# Patient Record
Sex: Female | Born: 1965 | Race: White | Hispanic: No | Marital: Married | Smoking: Never smoker
Health system: Southern US, Community
[De-identification: ages and names within clinical notes are randomized; demographics above are authoritative.]

## PROBLEM LIST (undated history)

## (undated) DIAGNOSIS — G43909 Migraine, unspecified, not intractable, without status migrainosus: Secondary | ICD-10-CM

## (undated) DIAGNOSIS — T7840XA Allergy, unspecified, initial encounter: Secondary | ICD-10-CM

## (undated) DIAGNOSIS — Z0000 Encounter for general adult medical examination without abnormal findings: Secondary | ICD-10-CM

## (undated) DIAGNOSIS — I10 Essential (primary) hypertension: Secondary | ICD-10-CM

## (undated) DIAGNOSIS — K529 Noninfective gastroenteritis and colitis, unspecified: Secondary | ICD-10-CM

## (undated) DIAGNOSIS — R635 Abnormal weight gain: Secondary | ICD-10-CM

## (undated) DIAGNOSIS — R1084 Generalized abdominal pain: Secondary | ICD-10-CM

## (undated) DIAGNOSIS — M1990 Unspecified osteoarthritis, unspecified site: Secondary | ICD-10-CM

## (undated) DIAGNOSIS — J069 Acute upper respiratory infection, unspecified: Secondary | ICD-10-CM

## (undated) HISTORY — DX: Allergy, unspecified, initial encounter: T78.40XA

## (undated) HISTORY — DX: Abnormal weight gain: R63.5

## (undated) HISTORY — DX: Noninfective gastroenteritis and colitis, unspecified: K52.9

## (undated) HISTORY — DX: Essential (primary) hypertension: I10

## (undated) HISTORY — DX: Acute upper respiratory infection, unspecified: J06.9

## (undated) HISTORY — DX: Generalized abdominal pain: R10.84

## (undated) HISTORY — DX: Unspecified osteoarthritis, unspecified site: M19.90

## (undated) HISTORY — PX: OVARY SURGERY: SHX727

## (undated) HISTORY — DX: Migraine, unspecified, not intractable, without status migrainosus: G43.909

## (undated) HISTORY — DX: Encounter for general adult medical examination without abnormal findings: Z00.00

## (undated) HISTORY — PX: CHOLECYSTECTOMY: SHX55

---

## 1998-11-25 ENCOUNTER — Emergency Department (HOSPITAL_COMMUNITY): Admission: EM | Admit: 1998-11-25 | Discharge: 1998-11-25 | Payer: Self-pay | Admitting: Emergency Medicine

## 1998-11-25 ENCOUNTER — Encounter: Payer: Self-pay | Admitting: Emergency Medicine

## 1999-01-11 ENCOUNTER — Other Ambulatory Visit: Admission: RE | Admit: 1999-01-11 | Discharge: 1999-01-11 | Payer: Self-pay | Admitting: Family Medicine

## 2000-01-31 ENCOUNTER — Other Ambulatory Visit: Admission: RE | Admit: 2000-01-31 | Discharge: 2000-01-31 | Payer: Self-pay | Admitting: Family Medicine

## 2001-03-26 ENCOUNTER — Other Ambulatory Visit: Admission: RE | Admit: 2001-03-26 | Discharge: 2001-03-26 | Payer: Self-pay | Admitting: Family Medicine

## 2004-06-01 ENCOUNTER — Other Ambulatory Visit: Admission: RE | Admit: 2004-06-01 | Discharge: 2004-06-01 | Payer: Self-pay | Admitting: Internal Medicine

## 2004-12-30 ENCOUNTER — Ambulatory Visit: Payer: Self-pay | Admitting: Family Medicine

## 2005-01-12 ENCOUNTER — Ambulatory Visit: Payer: Self-pay | Admitting: Family Medicine

## 2005-07-14 ENCOUNTER — Ambulatory Visit: Payer: Self-pay | Admitting: Family Medicine

## 2005-07-14 ENCOUNTER — Ambulatory Visit: Payer: Self-pay | Admitting: Cardiology

## 2005-08-10 ENCOUNTER — Encounter (INDEPENDENT_AMBULATORY_CARE_PROVIDER_SITE_OTHER): Payer: Self-pay | Admitting: Specialist

## 2005-08-10 ENCOUNTER — Observation Stay (HOSPITAL_COMMUNITY): Admission: RE | Admit: 2005-08-10 | Discharge: 2005-08-11 | Payer: Self-pay | Admitting: Obstetrics and Gynecology

## 2005-12-29 ENCOUNTER — Ambulatory Visit: Payer: Self-pay | Admitting: Family Medicine

## 2006-01-12 ENCOUNTER — Ambulatory Visit: Payer: Self-pay | Admitting: Family Medicine

## 2006-10-09 ENCOUNTER — Ambulatory Visit: Payer: Self-pay | Admitting: Family Medicine

## 2007-03-19 ENCOUNTER — Ambulatory Visit: Payer: Self-pay | Admitting: Family Medicine

## 2007-03-19 DIAGNOSIS — R635 Abnormal weight gain: Secondary | ICD-10-CM | POA: Insufficient documentation

## 2007-07-02 ENCOUNTER — Telehealth (INDEPENDENT_AMBULATORY_CARE_PROVIDER_SITE_OTHER): Payer: Self-pay | Admitting: *Deleted

## 2007-08-10 ENCOUNTER — Ambulatory Visit: Payer: Self-pay | Admitting: Family Medicine

## 2007-08-10 DIAGNOSIS — I10 Essential (primary) hypertension: Secondary | ICD-10-CM

## 2007-08-10 DIAGNOSIS — J069 Acute upper respiratory infection, unspecified: Secondary | ICD-10-CM | POA: Insufficient documentation

## 2007-11-27 ENCOUNTER — Encounter (INDEPENDENT_AMBULATORY_CARE_PROVIDER_SITE_OTHER): Payer: Self-pay | Admitting: Internal Medicine

## 2007-11-27 ENCOUNTER — Other Ambulatory Visit: Admission: RE | Admit: 2007-11-27 | Discharge: 2007-11-27 | Payer: Self-pay | Admitting: Family Medicine

## 2007-11-27 ENCOUNTER — Ambulatory Visit: Payer: Self-pay | Admitting: Family Medicine

## 2007-11-27 DIAGNOSIS — G43909 Migraine, unspecified, not intractable, without status migrainosus: Secondary | ICD-10-CM

## 2007-11-28 LAB — CONVERTED CEMR LAB
BUN: 10 mg/dL (ref 6–23)
CO2: 27 meq/L (ref 19–32)
Calcium: 9.5 mg/dL (ref 8.4–10.5)
Chloride: 106 meq/L (ref 96–112)
Cholesterol: 167 mg/dL (ref 0–200)
Creatinine, Ser: 0.8 mg/dL (ref 0.4–1.2)
GFR calc Af Amer: 102 mL/min
GFR calc non Af Amer: 84 mL/min
Glucose, Bld: 94 mg/dL (ref 70–99)
HDL: 38.9 mg/dL — ABNORMAL LOW (ref >39.0)
LDL Cholesterol: 107 mg/dL — ABNORMAL HIGH (ref 0–99)
Potassium: 4.4 meq/L (ref 3.5–5.1)
Sodium: 139 meq/L (ref 135–145)
TSH: 1.02 microintl units/mL (ref 0.35–5.50)
Total CHOL/HDL Ratio: 4.3
Triglycerides: 107 mg/dL (ref 0–149)
VLDL: 21 mg/dL (ref 0–40)

## 2007-12-12 ENCOUNTER — Ambulatory Visit: Payer: Self-pay | Admitting: Family Medicine

## 2007-12-12 DIAGNOSIS — K5289 Other specified noninfective gastroenteritis and colitis: Secondary | ICD-10-CM | POA: Insufficient documentation

## 2007-12-27 ENCOUNTER — Ambulatory Visit: Payer: Self-pay | Admitting: Internal Medicine

## 2008-01-10 ENCOUNTER — Ambulatory Visit: Payer: Self-pay | Admitting: Internal Medicine

## 2008-01-10 LAB — CONVERTED CEMR LAB
Fecal Occult Blood: NEGATIVE
OCCULT 1: NEGATIVE
OCCULT 2: NEGATIVE
OCCULT 3: NEGATIVE
OCCULT 4: NEGATIVE
OCCULT 5: NEGATIVE

## 2008-05-14 ENCOUNTER — Ambulatory Visit: Payer: Self-pay | Admitting: Family Medicine

## 2009-05-25 ENCOUNTER — Emergency Department (HOSPITAL_COMMUNITY): Admission: EM | Admit: 2009-05-25 | Discharge: 2009-05-25 | Payer: Self-pay | Admitting: Emergency Medicine

## 2009-05-27 ENCOUNTER — Ambulatory Visit: Payer: Self-pay | Admitting: Family Medicine

## 2009-05-27 DIAGNOSIS — R1084 Generalized abdominal pain: Secondary | ICD-10-CM | POA: Insufficient documentation

## 2009-05-29 LAB — CONVERTED CEMR LAB
ALT: 16 units/L (ref 0–35)
AST: 20 units/L (ref 0–37)
Albumin: 4.4 g/dL (ref 3.5–5.2)
Alkaline Phosphatase: 70 units/L (ref 39–117)
BUN: 13 mg/dL (ref 6–23)
Bilirubin, Direct: 0.1 mg/dL (ref 0.0–0.3)
CO2: 28 meq/L (ref 19–32)
Calcium: 9.1 mg/dL (ref 8.4–10.5)
Chloride: 111 meq/L (ref 96–112)
Cholesterol: 154 mg/dL (ref 0–200)
Creatinine, Ser: 0.8 mg/dL (ref 0.4–1.2)
GFR calc non Af Amer: 83.35 mL/min (ref >60)
Glucose, Bld: 98 mg/dL (ref 70–99)
HDL: 34.9 mg/dL — ABNORMAL LOW (ref >39.00)
LDL Cholesterol: 97 mg/dL (ref 0–99)
Potassium: 4.3 meq/L (ref 3.5–5.1)
Sodium: 142 meq/L (ref 135–145)
Total Bilirubin: 0.6 mg/dL (ref 0.3–1.2)
Total CHOL/HDL Ratio: 4
Total Protein: 7.4 g/dL (ref 6.0–8.3)
Triglycerides: 110 mg/dL (ref 0.0–149.0)
VLDL: 22 mg/dL (ref 0.0–40.0)

## 2010-04-21 ENCOUNTER — Telehealth (INDEPENDENT_AMBULATORY_CARE_PROVIDER_SITE_OTHER): Payer: Self-pay | Admitting: *Deleted

## 2010-04-22 ENCOUNTER — Ambulatory Visit: Payer: Self-pay | Admitting: Family Medicine

## 2010-04-23 LAB — CONVERTED CEMR LAB
Cholesterol: 122 mg/dL (ref 0–200)
Glucose, Bld: 93 mg/dL (ref 70–99)
HDL: 32.7 mg/dL — ABNORMAL LOW (ref >39.00)
LDL Cholesterol: 78 mg/dL (ref 0–99)
Total CHOL/HDL Ratio: 4
Triglycerides: 57 mg/dL (ref 0.0–149.0)
VLDL: 11.4 mg/dL (ref 0.0–40.0)

## 2010-11-11 NOTE — Progress Notes (Signed)
----   Converted from flag ---- ---- 04/21/2010 10:16 AM, Crawford Givens MD wrote: glucose and lipids v70.0  ---- 04/21/2010 10:14 AM, Mills Koller wrote: Patient is coming in for labs,Terri ------------------------------

## 2011-01-15 LAB — URINALYSIS, ROUTINE W REFLEX MICROSCOPIC
Bilirubin Urine: NEGATIVE
Hgb urine dipstick: NEGATIVE
Protein, ur: NEGATIVE mg/dL
Specific Gravity, Urine: 1.005 (ref 1.005–1.030)
Urobilinogen, UA: 0.2 mg/dL (ref 0.0–1.0)

## 2011-01-15 LAB — DIFFERENTIAL
Eosinophils Relative: 1 % (ref 0–5)
Lymphocytes Relative: 28 % (ref 12–46)
Lymphs Abs: 1.7 10*3/uL (ref 0.7–4.0)
Neutro Abs: 3.6 10*3/uL (ref 1.7–7.7)
Neutrophils Relative %: 59 % (ref 43–77)

## 2011-01-15 LAB — COMPREHENSIVE METABOLIC PANEL
AST: 21 U/L (ref 0–37)
CO2: 23 mEq/L (ref 19–32)
Calcium: 8.9 mg/dL (ref 8.4–10.5)
Creatinine, Ser: 0.76 mg/dL (ref 0.4–1.2)
GFR calc Af Amer: >60 mL/min (ref >60)
GFR calc non Af Amer: >60 mL/min (ref >60)

## 2011-01-15 LAB — CBC
MCHC: 34.4 g/dL (ref 30.0–36.0)
MCV: 89.7 fL (ref 78.0–100.0)
RBC: 4.48 MIL/uL (ref 3.87–5.11)

## 2011-01-15 LAB — LIPASE, BLOOD: Lipase: 34 U/L (ref 11–59)

## 2011-02-22 NOTE — Assessment & Plan Note (Signed)
[facility]                         GASTROENTEROLOGY OFFICE NOTE   NAME:Amber Baker, Amber Baker                        MRN:          [id number]  DATE:12/27/2007                            DOB:          1965-10-18    CHIEF COMPLAINT:  Mucus in stools.   Ms. Wurtzel noted in early February the onset of some intermittent mucus  in her stool.  There is no change in caliber no particular changes of  bleeding or anything like that.  In late February, early March she had  vomiting and diarrhea with a gastroenteritis, she had loose stool  through early March.  That has resolved.  She stopped seeing mucus in  between the stools but then it has been mixed with her stool.  One time  last week she had mucus.  It seems to be weaning somewhat.  No fever or  chills, no crampy abdominal pain, no incontinence, no nocturnal  symptoms.  It is notable that her mother-in-law died of lung cancer last  Fall and her husband was recently diagnosed and successfully treated for  bladder cancer.  She said that has raised her awareness about her body  functions and she has been concerned about the mucus in relation to  those events.  She actually has had some dreams of being on the commode  and straining to stool and has awakened and found herself in bed bearing  down somewhat but no defecation.  She has a history of hemorrhoids but  no bleeding from that.  Her GI review of systems is otherwise negative  at this time.  She saw Billie Bean for a physical and sore throat  problems, she has not had antibiotics.  CBC, TSH have been normal in the  last few months.  I have reviewed those records and the electronic  medical record.   PAST MEDICAL HISTORY:  1. Hypertension.  2. Nephrolithiasis 14 years ago.   PAST SURGICAL HISTORY:  1. Cholecystectomy 1995.  2. Oophorectomy, she thinks the left.   MEDICATIONS:  1. Ramipril 5 mg daily.  2. Calcium.  3. Magnesium.  4. Zinc supplement daily,  but intermittently.  5. Excedrin Migraine p.r.n.  6. BCs p.r.n.   ALLERGIES:  TO PENICILLIN ARE NOTED.   FAMILY HISTORY:  Diabetes in her aunt and has Crohn's disease.   SOCIAL HISTORY:  1. She is married.  2. One daughter.  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  4. No alcohol, tobacco or drugs.   REVIEW OF SYSTEMS:  Completely negative and has been reflected in my  medical history form.   PHYSICAL EXAM:  A well-developed, well-nourished white woman, height 5  feet 4 inches, weight 176 pounds, blood pressure 118/76, pulse 88.  EYES:  Anicteric.  MOUTH/POSTERIOR PHARYNX:  Free of lesions.  NECK:  Supple, without mass or thyromegaly.  CHEST:  Clear.  HEART:  S1 S2, no murmurs, rubs or gallops.  ABDOMEN:  Soft, nontender, no organomegaly or mass.  RECTAL EXAM:  In the presence of female nursing staff shows scanty brown  stool that is Hemoccult negative with no  mass.  There are some small old  external hemorrhoids noted.  SKIN:  Warm, dry, no acute rash.  EXTREMITIES:  Free of cyanosis, clubbing or edema.  She is alert and oriented x3 and cranial nerves II through XII are  grossly intact.  There is no cervical or supraclavicular  lymphadenopathy.   ASSESSMENT:  Mucus in her stools.  It sounds to me like she had the  start of that and then she had gastroenteritis which would cause some  loose stools but that has resolved.  I suspect overall this is probably  a variant of irritable bowel syndrome.  I explained to her why I thought  that, I think these recent family stressors may be playing some role.   RECOMMENDATIONS AND PLAN:  1. Home Hemoccults.  If positive, colonoscopy.  2. FiberCon 2-4 a day.  3. If she has persistent symptoms then a colonoscopy could be done,      but I told her I thought it was a little early to proceed to that.      However, if this persists or there are other changes do so.      Otherwise, assuming this smoothes out I would wait until she were      51 for a  screening colonoscopy.  She knows to call me if there are      changes and we will notify her regarding the Hemoccults.      Otherwise, she will follow up with Everrett Coombe, NP.     Iva Boop, MD,FACG  Electronically Signed    CEG/MedQ  DD: 12/27/2007  DT: 12/27/2007  Job #: [id number]   cc:   Willaim Sheng D. Bean, FNP

## 2011-02-25 NOTE — Op Note (Signed)
Amber Baker                 ACCOUNT NO.:  [id number]   MEDICAL RECORD NO.:  [id number]          PATIENT TYPE:  AMB   LOCATION:  SDC                           FACILITY:  [facility]   PHYSICIAN:  Miguel Aschoff, M.D.       DATE OF BIRTH:  1966/06/02   DATE OF PROCEDURE:  08/10/2005  DATE OF DISCHARGE:                                 OPERATIVE REPORT   PREOPERATIVE DIAGNOSIS:  8 cm left ovarian dermoid cyst.   POSTOPERATIVE DIAGNOSIS:  8 cm left ovarian dermoid cyst.   PROCEDURE:  Laparoscopic left salpingo-oophorectomy.   SURGEON:  Dr. Miguel Aschoff.   ASSISTANT:  Gerrit Friends. Aldona Bar, M.D.   ANESTHESIA:  General.   COMPLICATIONS:  None.   JUSTIFICATION:  The patient is a 45 year old white female noted on CT of the  abdomen to have a 6.9 x 5.6 x 8 cm left adnexal mass which was compatible  with a dermoid tumor. The pathology was discussed with the patient and the  options for therapy reviewed and she presents now to undergo laparoscopic  left salpingo-oophorectomy. The patient understands a laparotomy may be  necessary. Informed consent has been obtained.   PROCEDURE:  The patient was taken to the operating placed in supine  position. General anesthesia was administered without difficulty. She was  then placed in dorsolithotomy position, prepped and draped in usual sterile  fashion. Bladder was catheterized and Hulka tenaculum was placed through the  cervix and held. Attention was then directed to the umbilicus where a small  infraumbilical incision was made. A Veress needle was inserted and the  abdomen was insufflated with 3 liters CO2. Following insufflation, the  trocar to laparoscope was placed followed by laparoscope itself. Systematic  inspection revealed uterus to be normal size and shape, anterior bladder  peritoneum was unremarkable. The right tube was unremarkable as was the  right ovary. On the left side there was an 8 cm complex appearing ovarian  mass encompassing the whole  ovary with no normal ovarian tissue being  visible. There were no other abnormalities noted in the abdomen. At this  point a 5-mm suprapubic port was established under direct visualization as  well as 11 mm left lower quadrant port. Then using tripolar cautery forceps  it was possible to cauterize and cut across the left infundibulopelvic  ligament with care to avoid any injury to ureter. This dissection continued  until the utero-ovarian ligament was found, cauterized and cut. This  dissection incorporated cauterization and cutting of the meso-ovarian  ligament as well as the mesosalpinx. At this point it was possible to free  the left tube and ovary from the uterus and other pelvic structures. Once  specimen was free and EndoCatch bag was introduced and specimen was placed  in the EndoCatch bag and brought out through the 11 mm port in left lower  quadrant. At this point the fascia was incised. The EndoCatch bag opened.  The cyst drained and removed without difficulty. Once this was done, a final  inspection was made for hemostasis. Hemostasis appeared to be excellent. At  this point the CO2 was allowed to escape. All instruments were removed. The  subumbilical and suprapubic port sites were closed using subcuticular 4-0  Vicryl. The left lower quadrant port site was closed by placing retractors  in the incision to visualize the fascia. The fascia was then closed using  running interlocking 0 Vicryl suture.  Subcutaneous tissue was closed using interrupted 0 Vicryl suture and skin  incision was closed using the subcuticular 4-0 Vicryl. Estimated loss was  approximately 20 mL. The patient tolerated the procedure well and went to  the recovery room in satisfactory condition.      Miguel Aschoff, M.D.  Electronically Signed     AR/MEDQ  D:  08/10/2005  T:  08/10/2005  Job:  [id number]

## 2012-04-11 ENCOUNTER — Encounter: Payer: Self-pay | Admitting: Family Medicine

## 2012-04-16 ENCOUNTER — Ambulatory Visit
Admission: RE | Admit: 2012-04-16 | Discharge: 2012-04-16 | Disposition: A | Discharge status: Home / Self Care | Payer: BC Managed Care – PPO | Source: Ambulatory Visit | Attending: Family Medicine | Admitting: Family Medicine

## 2012-04-16 ENCOUNTER — Ambulatory Visit (INDEPENDENT_AMBULATORY_CARE_PROVIDER_SITE_OTHER): Payer: BC Managed Care – PPO | Admitting: Family Medicine

## 2012-04-16 ENCOUNTER — Encounter: Payer: Self-pay | Admitting: Family Medicine

## 2012-04-16 VITALS — BP 118/80 | HR 72 | Temp 98.4°F | Wt 184.0 lb

## 2012-04-16 DIAGNOSIS — R1032 Left lower quadrant pain: Secondary | ICD-10-CM

## 2012-04-16 LAB — POCT URINALYSIS DIPSTICK
Bilirubin, UA: NEGATIVE
Glucose, UA: NEGATIVE
Leukocytes, UA: NEGATIVE
Nitrite, UA: NEGATIVE

## 2012-04-16 NOTE — Addendum Note (Signed)
Addended by: Eliezer Bottom on: 04/16/2012 08:30 AM   Modules accepted: Orders

## 2012-04-16 NOTE — Progress Notes (Signed)
  Subjective:    Patient ID: Amber Baker, female    DOB: [birth year], 46 y.o.   MRN: [id number]  HPI  46 yo pleasant female new to me here for LLQ pain four days ago that has since resolved.  She was a little constipated- MOM helped with constipation and symptoms resolved shortly afterwards. No blood in stool. No nausea or vomiting. No dysuria.  Does have a h/o fibroid tumors- pain resolved shortly after her menstrual cycle subsided although it started before her bleeding started.  Never had anything like this before.  Patient Active Problem List  Diagnosis  . MIGRAINE HEADACHE  . ESSENTIAL HYPERTENSION, BENIGN  . URI  . GASTROENTERITIS  . WEIGHT GAIN  . ABDOMINAL PAIN, GENERALIZED  . Left lower quadrant pain   Past Medical History  Diagnosis Date  . Abdominal pain, generalized   . Essential hypertension, benign   . Gastroenteritis   . Migraine, unspecified, without mention of intractable migraine without mention of status migrainosus   . Acute upper respiratory infections of unspecified site   . Abnormal weight gain   . Routine general medical examination at a health care facility    No past surgical history on file. History  Substance Use Topics  . Smoking status: Never Smoker   . Smokeless tobacco: Not on file  . Alcohol Use: Not on file   No family history on file. Allergies  Allergen Reactions  . Penicillins     REACTION: unknown   Current Outpatient Prescriptions on File Prior to Visit  Medication Sig Dispense Refill  . loratadine (CLARITIN) 10 MG tablet Take 10 mg by mouth daily.      . pantoprazole (PROTONIX) 40 MG tablet Take 40 mg by mouth daily.      . ramipril (ALTACE) 5 MG capsule Take 5 mg by mouth daily.       The PMH, PSH, Social History, Family History, Medications, and allergies have been reviewed in [facility], and have been updated if relevant.   Review of Systems See HPI No fever    Objective:   Physical Exam BP 118/80  Pulse 72  Temp  98.4 F (36.9 C)  Wt 184 lb (83.462 kg)  General:  Well-developed,well-nourished,in no acute distress; alert,appropriate and cooperative throughout examination Head:  normocephalic and atraumatic.   Heart:  Normal rate and regular rhythm. S1 and S2 normal without gallop, murmur, click, rub or other extra sounds. Abdomen:  Bowel sounds positive,abdomen soft, mildy TTP over LLQ Skin:  Intact without suspicious lesions or rashes Psych:  Cognition and judgment appear intact. Alert and cooperative with normal attention span and concentration. No apparent delusions, illusions, hallucinations        Assessment & Plan:   1. Left lower quadrant pain  US Transvaginal Non-OB, US Pelvis Complete   New- resolving. UA pos for blood only - still at end of menstrual cycle. Abdominal exam reassuring today- no sign of acute abdomen and symptoms improving. ?uterine fibroid pain- will get pelvic ultrasound to evaluate further. If symptoms deteriorate again, consider CT of abd/pelvis. The patient indicates understanding of these issues and agrees with the plan.

## 2012-04-16 NOTE — Patient Instructions (Addendum)
It was nice to meet you. Please stop by to see Amber Baker on your way out to set up your ultrasound. If your symptoms, please call me immediately.

## 2012-07-23 ENCOUNTER — Other Ambulatory Visit: Payer: Self-pay

## 2012-07-23 MED ORDER — RAMIPRIL 5 MG PO CAPS: 5.0000 mg | ORAL_CAPSULE | Freq: Every day | ORAL | Status: DC

## 2012-07-23 NOTE — Telephone Encounter (Signed)
Pt left v/m requesting refill ramipril until seen 07/26/12. Pt notified done.

## 2012-07-26 ENCOUNTER — Encounter: Payer: Self-pay | Admitting: Family Medicine

## 2012-07-26 ENCOUNTER — Ambulatory Visit (INDEPENDENT_AMBULATORY_CARE_PROVIDER_SITE_OTHER): Payer: BC Managed Care – PPO | Admitting: Family Medicine

## 2012-07-26 VITALS — BP 102/78 | HR 68 | Temp 98.0°F | Wt 182.0 lb

## 2012-07-26 DIAGNOSIS — Z23 Encounter for immunization: Secondary | ICD-10-CM

## 2012-07-26 DIAGNOSIS — L989 Disorder of the skin and subcutaneous tissue, unspecified: Secondary | ICD-10-CM

## 2012-07-26 DIAGNOSIS — Z136 Encounter for screening for cardiovascular disorders: Secondary | ICD-10-CM

## 2012-07-26 DIAGNOSIS — Z0000 Encounter for general adult medical examination without abnormal findings: Secondary | ICD-10-CM

## 2012-07-26 DIAGNOSIS — I10 Essential (primary) hypertension: Secondary | ICD-10-CM

## 2012-07-26 LAB — LIPID PANEL
HDL: 37.1 mg/dL — ABNORMAL LOW (ref >39.00)
LDL Cholesterol: 90 mg/dL (ref 0–99)
Total CHOL/HDL Ratio: 4

## 2012-07-26 LAB — COMPREHENSIVE METABOLIC PANEL
ALT: 23 U/L (ref 0–35)
AST: 21 U/L (ref 0–37)
Albumin: 4.1 g/dL (ref 3.5–5.2)
Calcium: 9.2 mg/dL (ref 8.4–10.5)
Chloride: 105 mEq/L (ref 96–112)
Potassium: 4.1 mEq/L (ref 3.5–5.1)
Sodium: 138 mEq/L (ref 135–145)

## 2012-07-26 MED ORDER — FLUOCINONIDE-E 0.05 % EX CREA: TOPICAL_CREAM | Freq: Two times a day (BID) | CUTANEOUS | Status: DC

## 2012-07-26 MED ORDER — RAMIPRIL 5 MG PO CAPS: 5.0000 mg | ORAL_CAPSULE | Freq: Every day | ORAL | Status: DC

## 2012-07-26 NOTE — Patient Instructions (Addendum)
Good to see you. We will call you with your lab results.  Shirlee Limerick will call you with your dermatology appointment.

## 2012-07-26 NOTE — Addendum Note (Signed)
Addended by: Eliezer Bottom on: 07/26/2012 08:24 AM   Modules accepted: Orders

## 2012-07-26 NOTE — Progress Notes (Signed)
  Subjective:    Patient ID: Amber Baker, female    DOB: 27-Oct-1965, 46 y.o.   MRN: [id number]  HPI  46 yo very pleasant female here with h/o HTN here for rx refilled and for skin lesions.  1.  HTN- has been stable on Altace 5 mg daily. Denies any HA, blurred vision, LE edema or SOB.  2. Skin lesions- has itchy, flaky lesion in her scalp. Never had anything like this before- comes and goes for past several months.  Arms often get raised, itchy rash bilaterally- she is a hair stylist and works with color.  She is not sure if she is coming into contact with something she is allergic to. No wheezing or SOB.  Patient Active Problem List  Diagnosis  . MIGRAINE HEADACHE  . ESSENTIAL HYPERTENSION, BENIGN  . URI  . GASTROENTERITIS  . WEIGHT GAIN  . ABDOMINAL PAIN, GENERALIZED  . Left lower quadrant pain   Past Medical History  Diagnosis Date  . Abdominal pain, generalized   . Essential hypertension, benign   . Gastroenteritis   . Migraine, unspecified, without mention of intractable migraine without mention of status migrainosus   . Acute upper respiratory infections of unspecified site   . Abnormal weight gain   . Routine general medical examination at a health care facility    No past surgical history on file. History  Substance Use Topics  . Smoking status: Never Smoker   . Smokeless tobacco: Not on file  . Alcohol Use: Not on file   No family history on file. Allergies  Allergen Reactions  . Penicillins     REACTION: unknown   Current Outpatient Prescriptions on File Prior to Visit  Medication Sig Dispense Refill  . Evening Primrose Oil CAPS Take by mouth.      . fexofenadine (ALLEGRA) 180 MG tablet Take 180 mg by mouth daily.      . pantoprazole (PROTONIX) 40 MG tablet Take 40 mg by mouth daily.      Marland Kitchen DISCONTD: ramipril (ALTACE) 5 MG capsule Take 1 capsule (5 mg total) by mouth daily.  30 capsule  0   The PMH, PSH, Social History, Family History, Medications, and  allergies have been reviewed in [facility], and have been updated if relevant.   Review of Systems See HPI    Objective:   Physical Exam BP 102/78  Pulse 68  Temp 98 F (36.7 C)  Wt 182 lb (82.555 kg) Gen:  Alert, pleasant, NAD HEENT: MMM Small, flaky, scaly lesion on back of scalp, along hairline.  No other similar lesions. Resp:  CTA bilaterally CVS:  RRR Ext:  No edema Skin: Faint elevated macular rash on forearms bilaterally Psych:  Good eye contact.  Not anxious or depressed appearing.     Assessment & Plan:   1. Skin lesion  Area on scalp is suspicious for psoriasis- will refer to derm for biopsy and treatment. Area on arms does seem consistent with allergic dermatitis- advised trying new gloves at work. Lidex rx given. Ambulatory referral to Dermatology  2. Routine general medical examination at a health care facility  Comprehensive metabolic panel  3. Screening for ischemic heart disease  Lipid Panel  4. Essential hypertension, benign  Stable.  Altace refilled.

## 2012-07-30 ENCOUNTER — Encounter: Payer: Self-pay | Admitting: *Deleted

## 2013-02-14 ENCOUNTER — Encounter: Payer: Self-pay | Admitting: Family Medicine

## 2013-02-14 ENCOUNTER — Encounter: Payer: Self-pay | Admitting: *Deleted

## 2013-02-14 ENCOUNTER — Other Ambulatory Visit (HOSPITAL_COMMUNITY)
Admission: RE | Admit: 2013-02-14 | Discharge: 2013-02-14 | Disposition: A | Discharge status: Home / Self Care | Payer: BC Managed Care – PPO | Source: Ambulatory Visit | Attending: Family Medicine | Admitting: Family Medicine

## 2013-02-14 ENCOUNTER — Ambulatory Visit (INDEPENDENT_AMBULATORY_CARE_PROVIDER_SITE_OTHER): Payer: BC Managed Care – PPO | Admitting: Family Medicine

## 2013-02-14 VITALS — BP 120/82 | HR 64 | Temp 98.0°F | Ht 64.25 in | Wt 188.0 lb

## 2013-02-14 DIAGNOSIS — Z0000 Encounter for general adult medical examination without abnormal findings: Secondary | ICD-10-CM | POA: Insufficient documentation

## 2013-02-14 DIAGNOSIS — Z1231 Encounter for screening mammogram for malignant neoplasm of breast: Secondary | ICD-10-CM

## 2013-02-14 DIAGNOSIS — I10 Essential (primary) hypertension: Secondary | ICD-10-CM

## 2013-02-14 DIAGNOSIS — Z1151 Encounter for screening for human papillomavirus (HPV): Secondary | ICD-10-CM | POA: Insufficient documentation

## 2013-02-14 DIAGNOSIS — Z136 Encounter for screening for cardiovascular disorders: Secondary | ICD-10-CM

## 2013-02-14 DIAGNOSIS — R635 Abnormal weight gain: Secondary | ICD-10-CM

## 2013-02-14 DIAGNOSIS — J309 Allergic rhinitis, unspecified: Secondary | ICD-10-CM

## 2013-02-14 DIAGNOSIS — Z01419 Encounter for gynecological examination (general) (routine) without abnormal findings: Secondary | ICD-10-CM | POA: Insufficient documentation

## 2013-02-14 LAB — COMPREHENSIVE METABOLIC PANEL
ALT: 36 U/L — ABNORMAL HIGH (ref 0–35)
AST: 34 U/L (ref 0–37)
CO2: 26 mEq/L (ref 19–32)
Chloride: 107 mEq/L (ref 96–112)
GFR: 85.65 mL/min (ref >60.00)
Sodium: 138 mEq/L (ref 135–145)
Total Bilirubin: 0.6 mg/dL (ref 0.3–1.2)
Total Protein: 7 g/dL (ref 6.0–8.3)

## 2013-02-14 LAB — CBC WITH DIFFERENTIAL/PLATELET
Basophils Relative: 0.8 % (ref 0.0–3.0)
Eosinophils Relative: 4.2 % (ref 0.0–5.0)
Lymphocytes Relative: 25.3 % (ref 12.0–46.0)
MCV: 88.9 fl (ref 78.0–100.0)
Monocytes Absolute: 0.7 10*3/uL (ref 0.1–1.0)
Monocytes Relative: 12 % (ref 3.0–12.0)
Neutrophils Relative %: 57.7 % (ref 43.0–77.0)
Platelets: 330 10*3/uL (ref 150.0–400.0)
RBC: 4.55 Mil/uL (ref 3.87–5.11)
WBC: 5.7 10*3/uL (ref 4.5–10.5)

## 2013-02-14 LAB — LIPID PANEL
Total CHOL/HDL Ratio: 3
VLDL: 12 mg/dL (ref 0.0–40.0)

## 2013-02-14 MED ORDER — FLUTICASONE PROPIONATE 50 MCG/ACT NA SUSP: 2.0000 | Freq: Every day | NASAL | Status: DC

## 2013-02-14 NOTE — Addendum Note (Signed)
Addended by: Eliezer Bottom on: 02/14/2013 10:30 AM   Modules accepted: Orders

## 2013-02-14 NOTE — Progress Notes (Signed)
Subjective:    Patient ID: Amber Baker, female    DOB: 1966-01-02, 47 y.o.   MRN: [id number]  HPI  47 yo G1P1 here for CPX/pap smear. Last pap smear "several years ago."  No h/o abnormal pap smears. Still having periods, though becoming irregular and having some hot flashes- not too bad. Due for mammogram. Paternal aunt had breast CA at older age, otherwise no family h/o breast, uterine, cervical or ovarian CA.  Allergic rhinitis- typically controlled with Allegra.  Past several weeks, still having runny nose,itchy eyes, congestion.  Patient Active Problem List   Diagnosis Date Noted  . Routine general medical examination at a health care facility 02/14/2013  . Allergic rhinitis 02/14/2013  . MIGRAINE HEADACHE 11/27/2007  . ESSENTIAL HYPERTENSION, BENIGN 08/10/2007  . WEIGHT GAIN 03/19/2007   Past Medical History  Diagnosis Date  . Abdominal pain, generalized   . Essential hypertension, benign   . Gastroenteritis   . Migraine, unspecified, without mention of intractable migraine without mention of status migrainosus   . Acute upper respiratory infections of unspecified site   . Abnormal weight gain   . Routine general medical examination at a health care facility    No past surgical history on file. History  Substance Use Topics  . Smoking status: Never Smoker   . Smokeless tobacco: Not on file  . Alcohol Use: Not on file   No family history on file. Allergies  Allergen Reactions  . Penicillins     REACTION: unknown   Current Outpatient Prescriptions on File Prior to Visit  Medication Sig Dispense Refill  . Evening Primrose Oil CAPS Take by mouth.      . fexofenadine (ALLEGRA) 180 MG tablet Take 180 mg by mouth daily.      . fluocinonide-emollient (LIDEX-E) 0.05 % cream Apply topically 2 (two) times daily.  30 g  0  . pantoprazole (PROTONIX) 40 MG tablet Take 40 mg by mouth daily.      . ramipril (ALTACE) 5 MG capsule Take 1 capsule (5 mg total) by mouth daily.  30  capsule  11   No current facility-administered medications on file prior to visit.   The PMH, PSH, Social History, Family History, Medications, and allergies have been reviewed in [facility], and have been updated if relevant.   Review of Systems See HPI  Patient reports no  vision/ hearing changes,anorexia, weight change, fever ,adenopathy, persistant / recurrent hoarseness, swallowing issues, chest pain, edema,persistant / recurrent cough, hemoptysis, dyspnea(rest, exertional, paroxysmal nocturnal), gastrointestinal  bleeding (melena, rectal bleeding), abdominal pain, excessive heart burn, GU symptoms(dysuria, hematuria, pyuria, voiding/incontinence  Issues) syncope, focal weakness, severe memory loss, concerning skin lesions, depression, anxiety, abnormal bruising/bleeding, major joint swelling, breast masses or abnormal vaginal bleeding.     Objective:   Physical Exam BP 120/82  Pulse 64  Temp(Src) 98 F (36.7 C)  Ht 5' 4.25" (1.632 m)  Wt 188 lb (85.276 kg)  BMI 32.02 kg/m2  General:  Well-developed,well-nourished,in no acute distress; alert,appropriate and cooperative throughout examination Head:  normocephalic and atraumatic.   Eyes:  vision grossly intact, pupils equal, pupils round, and pupils reactive to light.   Ears:  R ear normal and L ear normal.   Nose:  no external deformity.   +boggy and erythematous turbinates, no sinus tenderness Mouth:  good dentition.   Neck:  No deformities, masses, or tenderness noted. Breasts:  No mass, nodules, thickening, tenderness, bulging, retraction, inflamation, nipple discharge or skin changes noted.   Lungs:  Normal respiratory effort, chest expands symmetrically. Lungs are clear to auscultation, no crackles or wheezes. Heart:  Normal rate and regular rhythm. S1 and S2 normal without gallop, murmur, click, rub or other extra sounds. Abdomen:  Bowel sounds positive,abdomen soft and non-tender without masses, organomegaly or hernias  noted. Rectal:  no external abnormalities.   Genitalia:  Pelvic Exam:        External: normal female genitalia without lesions or masses        Vagina: normal without lesions or masses        Cervix: normal without lesions or masses        Adnexa: normal bimanual exam without masses or fullness        Uterus: normal by palpation        Pap smear: performed Msk:  No deformity or scoliosis noted of thoracic or lumbar spine.   Extremities:  No clubbing, cyanosis, edema, or deformity noted with normal full range of motion of all joints.   Neurologic:  alert & oriented X3 and gait normal.   Skin:  Intact without suspicious lesions or rashes Cervical Nodes:  No lymphadenopathy noted Axillary Nodes:  No palpable lymphadenopathy Psych:  Cognition and judgment appear intact. Alert and cooperative with normal attention span and concentration. No apparent delusions, illusions, hallucinations        Assessment & Plan:  1. Routine general medical examination at a health care facility Reviewed preventive care protocols, scheduled due services, and updated immunizations Discussed nutrition, exercise, diet, and healthy lifestyle.  - Comprehensive metabolic panel - CBC with Differential  2. Essential hypertension, benign Stable on Altace.  No changes.  3. Other screening mammogram  - MM Digital Screening; Future  4. Allergic rhinitis Deteriorated.  Add flonase to antihistamine. Call or return to clinic prn if these symptoms worsen or fail to improve as anticipated. The patient indicates understanding of these issues and agrees with the plan.   5. Screening for ischemic heart disease  - Lipid Panel

## 2013-02-14 NOTE — Patient Instructions (Addendum)
Great to see you. 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 We will call you with your lab results, send you a letter with your pap smear results unless it is abnormal. Please stop by to see Shirlee Limerick on your way out to set up your referral for your mammogram.

## 2013-02-19 ENCOUNTER — Encounter: Payer: Self-pay | Admitting: *Deleted

## 2013-02-21 ENCOUNTER — Encounter: Payer: Self-pay | Admitting: Family Medicine

## 2013-03-11 ENCOUNTER — Encounter: Payer: Self-pay | Admitting: Family Medicine

## 2013-08-13 ENCOUNTER — Other Ambulatory Visit: Payer: Self-pay | Admitting: *Deleted

## 2013-08-13 MED ORDER — RAMIPRIL 5 MG PO CAPS: 5.0000 mg | ORAL_CAPSULE | Freq: Every day | ORAL | Status: DC

## 2014-01-23 ENCOUNTER — Ambulatory Visit (INDEPENDENT_AMBULATORY_CARE_PROVIDER_SITE_OTHER): Payer: BC Managed Care – PPO | Admitting: Family Medicine

## 2014-01-23 ENCOUNTER — Encounter: Payer: Self-pay | Admitting: Family Medicine

## 2014-01-23 VITALS — BP 130/90 | HR 83 | Temp 98.2°F | Wt 188.0 lb

## 2014-01-23 DIAGNOSIS — J069 Acute upper respiratory infection, unspecified: Secondary | ICD-10-CM

## 2014-01-23 MED ORDER — RAMIPRIL 5 MG PO CAPS: 5.0000 mg | ORAL_CAPSULE | Freq: Every day | ORAL | Status: DC

## 2014-01-23 MED ORDER — SUMATRIPTAN SUCCINATE 50 MG PO TABS: 50.0000 mg | ORAL_TABLET | Freq: Once | ORAL | Status: DC

## 2014-01-23 MED ORDER — AZITHROMYCIN 250 MG PO TABS: ORAL_TABLET | ORAL | Status: DC

## 2014-01-23 NOTE — Progress Notes (Signed)
Pre visit review using our clinic review tool, if applicable. No additional management support is needed unless otherwise documented below in the visit note. 

## 2014-01-23 NOTE — Progress Notes (Signed)
SUBJECTIVE:  Amber Baker is a 48 y.o. female who complains of coryza, congestion, sneezing, sore throat and bilateral sinus pain for 6 days. She denies a history of anorexia, chest pain, fevers and shortness of breath and denies a history of asthma. Patient denies smoke cigarettes.   Patient Active Problem List   Diagnosis Date Noted  . Routine general medical examination at a health care facility 02/14/2013  . Allergic rhinitis 02/14/2013  . MIGRAINE HEADACHE 11/27/2007  . ESSENTIAL HYPERTENSION, BENIGN 08/10/2007  . WEIGHT GAIN 03/19/2007   Past Medical History  Diagnosis Date  . Abdominal pain, generalized   . Essential hypertension, benign   . Gastroenteritis   . Migraine, unspecified, without mention of intractable migraine without mention of status migrainosus   . Acute upper respiratory infections of unspecified site   . Abnormal weight gain   . Routine general medical examination at a health care facility    No past surgical history on file. History  Substance Use Topics  . Smoking status: Never Smoker   . Smokeless tobacco: Not on file  . Alcohol Use: Not on file   No family history on file. Allergies  Allergen Reactions  . Penicillins     REACTION: unknown   Current Outpatient Prescriptions on File Prior to Visit  Medication Sig Dispense Refill  . Evening Primrose Oil CAPS Take by mouth.      . fexofenadine (ALLEGRA) 180 MG tablet Take 180 mg by mouth daily.      . fluticasone (FLONASE) 50 MCG/ACT nasal spray Place 2 sprays into the nose daily.  16 g  6   No current facility-administered medications on file prior to visit.   The PMH, PSH, Social History, Family History, Medications, and allergies have been reviewed in [facility], and have been updated if relevant.  OBJECTIVE: BP 130/90  Pulse 83  Temp(Src) 98.2 F (36.8 C) (Tympanic)  Wt 188 lb (85.276 kg)  SpO2 98%  She appears well, vital signs are as noted. Ears normal.  Throat and pharynx normal.  Neck  supple. No adenopathy in the neck. Nose is congested. Sinuses non tender. The chest is clear, without wheezes or rales.  ASSESSMENT:  viral upper respiratory illness  PLAN: Symptomatic therapy suggested: push fluids, rest and return office visit prn if symptoms persist or worsen. Lack of antibiotic effectiveness discussed with her. Did print out an rx for zpack (PCN allergic) to fill if symptoms do not improve or worsen. Call or return to clinic prn if these symptoms worsen or fail to improve as anticipated.

## 2014-01-23 NOTE — Patient Instructions (Signed)
Take antibiotic as directed if your symptoms do not improve over next several days.  Drink lots of fluids.   Treat sympotmatically with Mucinex, nasal saline irrigation, and Tylenol/Ibuprofen.  Try over the counter nasocort-start with 2 sprays per nostril per day...and then try to taper to 1 spray per nostril once symptoms improve.   You can use warm compresses.     Call if not improving as expected in 5-7 days.

## 2014-02-24 ENCOUNTER — Other Ambulatory Visit: Payer: Self-pay | Admitting: Family Medicine

## 2014-03-30 ENCOUNTER — Other Ambulatory Visit: Payer: Self-pay | Admitting: Family Medicine

## 2014-03-31 NOTE — Telephone Encounter (Signed)
Pt has cpx sxheduled 04/29/14.

## 2014-04-29 ENCOUNTER — Ambulatory Visit (INDEPENDENT_AMBULATORY_CARE_PROVIDER_SITE_OTHER): Payer: BC Managed Care – PPO | Admitting: Family Medicine

## 2014-04-29 ENCOUNTER — Encounter: Payer: Self-pay | Admitting: Family Medicine

## 2014-04-29 VITALS — BP 138/82 | HR 75 | Temp 98.0°F | Ht 64.0 in | Wt 190.5 lb

## 2014-04-29 DIAGNOSIS — I10 Essential (primary) hypertension: Secondary | ICD-10-CM

## 2014-04-29 DIAGNOSIS — R238 Other skin changes: Secondary | ICD-10-CM

## 2014-04-29 DIAGNOSIS — L989 Disorder of the skin and subcutaneous tissue, unspecified: Secondary | ICD-10-CM

## 2014-04-29 DIAGNOSIS — Z136 Encounter for screening for cardiovascular disorders: Secondary | ICD-10-CM

## 2014-04-29 DIAGNOSIS — Z1231 Encounter for screening mammogram for malignant neoplasm of breast: Secondary | ICD-10-CM

## 2014-04-29 DIAGNOSIS — Z0000 Encounter for general adult medical examination without abnormal findings: Secondary | ICD-10-CM

## 2014-04-29 LAB — COMPREHENSIVE METABOLIC PANEL
ALT: 24 U/L (ref 0–35)
AST: 26 U/L (ref 0–37)
Albumin: 4.2 g/dL (ref 3.5–5.2)
Alkaline Phosphatase: 63 U/L (ref 39–117)
BILIRUBIN TOTAL: 0.8 mg/dL (ref 0.2–1.2)
BUN: 14 mg/dL (ref 6–23)
CALCIUM: 9.1 mg/dL (ref 8.4–10.5)
CHLORIDE: 104 meq/L (ref 96–112)
CO2: 23 meq/L (ref 19–32)
Creatinine, Ser: 0.8 mg/dL (ref 0.4–1.2)
GFR: 86.5 mL/min (ref >60.00)
Glucose, Bld: 95 mg/dL (ref 70–99)
Potassium: 4.1 mEq/L (ref 3.5–5.1)
SODIUM: 136 meq/L (ref 135–145)
TOTAL PROTEIN: 7.3 g/dL (ref 6.0–8.3)

## 2014-04-29 LAB — CBC WITH DIFFERENTIAL/PLATELET
BASOS ABS: 0 10*3/uL (ref 0.0–0.1)
Basophils Relative: 0.4 % (ref 0.0–3.0)
Eosinophils Absolute: 0.1 10*3/uL (ref 0.0–0.7)
Eosinophils Relative: 1.1 % (ref 0.0–5.0)
HCT: 41.5 % (ref 36.0–46.0)
Hemoglobin: 14 g/dL (ref 12.0–15.0)
LYMPHS PCT: 25.1 % (ref 12.0–46.0)
Lymphs Abs: 1.3 10*3/uL (ref 0.7–4.0)
MCHC: 33.8 g/dL (ref 30.0–36.0)
MCV: 89.2 fl (ref 78.0–100.0)
MONOS PCT: 10.3 % (ref 3.0–12.0)
Monocytes Absolute: 0.5 10*3/uL (ref 0.1–1.0)
NEUTROS PCT: 63.1 % (ref 43.0–77.0)
Neutro Abs: 3.3 10*3/uL (ref 1.4–7.7)
PLATELETS: 347 10*3/uL (ref 150.0–400.0)
RBC: 4.65 Mil/uL (ref 3.87–5.11)
RDW: 13.5 % (ref 11.5–15.5)
WBC: 5.2 10*3/uL (ref 4.0–10.5)

## 2014-04-29 LAB — LIPID PANEL
Cholesterol: 176 mg/dL (ref 0–200)
HDL: 50.1 mg/dL (ref >39.00)
LDL Cholesterol: 115 mg/dL — ABNORMAL HIGH (ref 0–99)
NONHDL: 125.9
Total CHOL/HDL Ratio: 4
Triglycerides: 56 mg/dL (ref 0.0–149.0)
VLDL: 11.2 mg/dL (ref 0.0–40.0)

## 2014-04-29 LAB — TSH: TSH: 0.97 u[IU]/mL (ref 0.35–4.50)

## 2014-04-29 MED ORDER — FLUOCINONIDE-E 0.05 % EX CREA: TOPICAL_CREAM | Freq: Two times a day (BID) | CUTANEOUS | Status: DC

## 2014-04-29 NOTE — Patient Instructions (Addendum)
Good to see you- please stop by to see Shirlee LimerickMarion on your way out to set up your mammogram- ask about 3 D mammography.  We will call you with your lab results.

## 2014-04-29 NOTE — Progress Notes (Signed)
Subjective:    Patient ID: Amber Baker, female    DOB: 01/16/1966, 48 y.o.   MRN: [id number]  HPI  48 yo G1P1 here for CPX.  Last pap smear 02/14/13 (done by me).  No h/o abnormal pap smears. Due for mammogram. Last mammogram 03/07/13- recommended follow up in 1 year with 3 D mammogram. Periods are becoming irregular- every other month and light. Some mild hot flashes but no issues with insomnia.  Paternal aunt had breast CA at older age, otherwise no family h/o breast, uterine, cervical or ovarian CA.  Scalp lesion- gave her lidex last year and referred her to dermatology.  Was told it was a dermatitis, not psoriasis.  Given kenalog spray which was not effective. She wonders if it was from a high protein/low carb diet she did last year.  Had a friend who did a similar diet and developed a similar scalp lesion.  HTN- BP has been well controlled on Altace 5 mg daily. Denies HA, blurred vision, CP or SOB.   Patient Active Problem List   Diagnosis Date Noted  . Scalp irritation 04/29/2014  . Routine general medical examination at a health care facility 02/14/2013  . Allergic rhinitis 02/14/2013  . MIGRAINE HEADACHE 11/27/2007  . ESSENTIAL HYPERTENSION, BENIGN 08/10/2007  . WEIGHT GAIN 03/19/2007   Past Medical History  Diagnosis Date  . Abdominal pain, generalized   . Essential hypertension, benign   . Gastroenteritis   . Migraine, unspecified, without mention of intractable migraine without mention of status migrainosus   . Acute upper respiratory infections of unspecified site   . Abnormal weight gain   . Routine general medical examination at a health care facility    No past surgical history on file. History  Substance Use Topics  . Smoking status: Never Smoker   . Smokeless tobacco: Not on file  . Alcohol Use: Not on file   No family history on file. Allergies  Allergen Reactions  . Penicillins     REACTION: unknown   Current Outpatient Prescriptions on File  Prior to Visit  Medication Sig Dispense Refill  . fexofenadine (ALLEGRA) 180 MG tablet Take 180 mg by mouth daily.      . fluticasone (FLONASE) 50 MCG/ACT nasal spray Place 2 sprays into the nose daily.  16 g  6  . ramipril (ALTACE) 5 MG capsule Take 1 capsule (5 mg total) by mouth daily.  90 capsule  3  . SUMAtriptan (IMITREX) 50 MG tablet TAKE ONE TABLET AS NEEDED FOR MIGRAINE. MAY REPEAT IN TWO HOURS IF NEEDED *MAX OF 2 TABLETS IN 24 HOURS*  9 tablet  0   No current facility-administered medications on file prior to visit.   The PMH, PSH, Social History, Family History, Medications, and allergies [facility], and have been updated if relevant.   Review of Systems See HPI  Patient reports no  vision/ hearing changes,anorexia, weight change, fever ,adenopathy, persistant / recurrent hoarseness, swallowing issues, chest pain, edema,persistant / recurrent cough, hemoptysis, dyspnea(rest, exertional, paroxysmal nocturnal), gastrointestinal  bleeding (melena, rectal bleeding), abdominal pain, excessive heart burn, GU symptoms(dysuria, hematuria, pyuria, voiding/incontinence  Issues) syncope, focal weakness, severe memory loss, concerning skin lesions, depression, anxiety, abnormal bruising/bleeding, major joint swelling, breast masses or abnormal vaginal bleeding.     Objective:   Physical Exam BP 138/82  Pulse 75  Temp(Src) 98 F (36.7 C) (Oral)  Ht 5\' 4"  (1.626 m)  Wt 190 lb 8 oz (86.41 kg)  BMI  32.68 kg/m2  SpO2 97%  LMP 03/24/2014  General:  Well-developed,well-nourished,in no acute distress; alert,appropriate and cooperative throughout examination Head:  normocephalic and atraumatic.   Eyes:  vision grossly intact, pupils equal, pupils round, and pupils reactive to light.   Ears:  R ear normal and L ear normal.   Mouth:  good dentition.   Neck:  No deformities, masses, or tenderness noted. Breasts:  No mass, nodules, thickening, tenderness, bulging, retraction,  inflamation, nipple discharge or skin changes noted.   Lungs:  Normal respiratory effort, chest expands symmetrically. Lungs are clear to auscultation, no crackles or wheezes. Heart:  Normal rate and regular rhythm. S1 and S2 normal without gallop, murmur, click, rub or other extra sounds. Abdomen:  Bowel sounds positive,abdomen soft and non-tender without masses, organomegaly or hernias noted. Msk:  No deformity or scoliosis noted of thoracic or lumbar spine.   Extremities:  No clubbing, cyanosis, edema, or deformity noted with normal full range of motion of all joints.   Neurologic:  alert & oriented X3 and gait normal.   Skin:  Intact without suspicious lesions or rashes Cervical Nodes:  No lymphadenopathy noted Axillary Nodes:  No palpable lymphadenopathy Psych:  Cognition and judgment appear intact. Alert and cooperative with normal attention span and concentration. No apparent delusions, illusions, hallucinations        Assessment & Plan:

## 2014-04-29 NOTE — Assessment & Plan Note (Signed)
Stable on current rx. No changes. 

## 2014-04-29 NOTE — Assessment & Plan Note (Signed)
Reviewed preventive care protocols, scheduled due services, and updated immunizations Discussed nutrition, exercise, diet, and healthy lifestyle.  Orders Placed This Encounter  Procedures  . MM Digital Screening  . CBC with Differential  . Comprehensive metabolic panel  . Lipid panel  . TSH     

## 2014-04-29 NOTE — Assessment & Plan Note (Signed)
Etiology remains unclear but did respond well to topical steroids. Advised follow up with another dermatologist for biopsy.

## 2014-04-30 ENCOUNTER — Encounter: Payer: Self-pay | Admitting: *Deleted

## 2014-04-30 ENCOUNTER — Telehealth: Payer: Self-pay | Admitting: Family Medicine

## 2014-04-30 NOTE — Telephone Encounter (Signed)
Relevant patient education mailed to patient.  

## 2014-05-20 ENCOUNTER — Other Ambulatory Visit: Payer: Self-pay | Admitting: Family Medicine

## 2014-08-18 ENCOUNTER — Other Ambulatory Visit: Payer: Self-pay | Admitting: Family Medicine

## 2014-11-24 ENCOUNTER — Other Ambulatory Visit: Payer: Self-pay | Admitting: Family Medicine

## 2014-11-25 NOTE — Telephone Encounter (Signed)
Last filled 08/28/14--had CPE 04/2014--please advise

## 2015-01-12 ENCOUNTER — Ambulatory Visit: Payer: BC Managed Care – PPO | Admitting: Family Medicine

## 2015-01-19 ENCOUNTER — Ambulatory Visit (INDEPENDENT_AMBULATORY_CARE_PROVIDER_SITE_OTHER): Payer: BC Managed Care – PPO | Admitting: Family Medicine

## 2015-01-19 ENCOUNTER — Encounter: Payer: Self-pay | Admitting: Family Medicine

## 2015-01-19 VITALS — BP 114/72 | HR 74 | Temp 98.3°F | Ht 64.0 in | Wt 180.5 lb

## 2015-01-19 DIAGNOSIS — R5383 Other fatigue: Secondary | ICD-10-CM | POA: Insufficient documentation

## 2015-01-19 DIAGNOSIS — Z13220 Encounter for screening for lipoid disorders: Secondary | ICD-10-CM

## 2015-01-19 DIAGNOSIS — I10 Essential (primary) hypertension: Secondary | ICD-10-CM | POA: Diagnosis not present

## 2015-01-19 LAB — CBC WITH DIFFERENTIAL/PLATELET
BASOS ABS: 0 10*3/uL (ref 0.0–0.1)
Basophils Relative: 0.7 % (ref 0.0–3.0)
EOS ABS: 0.2 10*3/uL (ref 0.0–0.7)
Eosinophils Relative: 3.2 % (ref 0.0–5.0)
HEMATOCRIT: 42 % (ref 36.0–46.0)
Hemoglobin: 14.3 g/dL (ref 12.0–15.0)
LYMPHS ABS: 1.7 10*3/uL (ref 0.7–4.0)
Lymphocytes Relative: 29.3 % (ref 12.0–46.0)
MCHC: 34 g/dL (ref 30.0–36.0)
MCV: 87.5 fl (ref 78.0–100.0)
MONO ABS: 0.7 10*3/uL (ref 0.1–1.0)
Monocytes Relative: 11.6 % (ref 3.0–12.0)
NEUTROS PCT: 55.2 % (ref 43.0–77.0)
Neutro Abs: 3.1 10*3/uL (ref 1.4–7.7)
PLATELETS: 332 10*3/uL (ref 150.0–400.0)
RBC: 4.8 Mil/uL (ref 3.87–5.11)
RDW: 13.5 % (ref 11.5–15.5)
WBC: 5.7 10*3/uL (ref 4.0–10.5)

## 2015-01-19 LAB — COMPREHENSIVE METABOLIC PANEL
ALBUMIN: 4.2 g/dL (ref 3.5–5.2)
ALT: 19 U/L (ref 0–35)
AST: 18 U/L (ref 0–37)
Alkaline Phosphatase: 63 U/L (ref 39–117)
BUN: 16 mg/dL (ref 6–23)
CHLORIDE: 106 meq/L (ref 96–112)
CO2: 28 mEq/L (ref 19–32)
Calcium: 9.5 mg/dL (ref 8.4–10.5)
Creatinine, Ser: 0.77 mg/dL (ref 0.40–1.20)
GFR: 84.94 mL/min (ref >60.00)
Glucose, Bld: 94 mg/dL (ref 70–99)
POTASSIUM: 4.3 meq/L (ref 3.5–5.1)
Sodium: 139 mEq/L (ref 135–145)
Total Bilirubin: 0.4 mg/dL (ref 0.2–1.2)
Total Protein: 7.3 g/dL (ref 6.0–8.3)

## 2015-01-19 LAB — LIPID PANEL
CHOL/HDL RATIO: 4
Cholesterol: 164 mg/dL (ref 0–200)
HDL: 44.1 mg/dL (ref >39.00)
LDL Cholesterol: 99 mg/dL (ref 0–99)
NonHDL: 119.9
Triglycerides: 103 mg/dL (ref 0.0–149.0)
VLDL: 20.6 mg/dL (ref 0.0–40.0)

## 2015-01-19 LAB — VITAMIN D 25 HYDROXY (VIT D DEFICIENCY, FRACTURES): VITD: 26.34 ng/mL — AB (ref 30.00–100.00)

## 2015-01-19 LAB — TSH: TSH: 1.08 u[IU]/mL (ref 0.35–4.50)

## 2015-01-19 LAB — VITAMIN B12: Vitamin B-12: 771 pg/mL (ref 211–911)

## 2015-01-19 NOTE — Assessment & Plan Note (Signed)
New- likely multifactorial. Exam reassuring.  Start work up with labs today- Orders Placed This Encounter  Procedures  . CBC with Differential/Platelet  . Comprehensive metabolic panel  . Lipid panel  . TSH  . Vitamin D, 25-hydroxy  . Vitamin B12   The patient indicates understanding of these issues and agrees with the plan.

## 2015-01-19 NOTE — Progress Notes (Signed)
Pre visit review using our clinic review tool, if applicable. No additional management support is needed unless otherwise documented below in the visit note. 

## 2015-01-19 NOTE — Progress Notes (Signed)
Subjective:   Patient ID: Amber Baker, female    DOB: 02/07/1966, 49 y.o.   MRN: [id number]  Amber Baker is a pleasant 49 y.o. year old female who presents to clinic today with Follow-up  on 01/19/2015  HPI:  HTN-  BP has been well controlled on low dose Altace.  Denies any HA, blurred vision, LE edema, CP or SOB.  Fatigue- has noticed she has been more tired lately. Started taking OTC Vit B12- 1000 mcg daily and is already feeling a little better.    No blood in stool.  No changes in bowel habits.  No dizziness.   Has noticed difficulty losing weight.  Has lost 10 pounds since I last saw her but she feels she is on a plateau- walking twice a day, cut back on breads and pastas.  Lab Results  Component Value Date   TSH 0.97 04/29/2014   Lab Results  Component Value Date   WBC 5.2 04/29/2014   HGB 14.0 04/29/2014   HCT 41.5 04/29/2014   MCV 89.2 04/29/2014   PLT 347.0 04/29/2014   Wt Readings from Last 3 Encounters:  01/19/15 180 lb 8 oz (81.874 kg)  04/29/14 190 lb 8 oz (86.41 kg)  01/23/14 188 lb (85.276 kg)    Current Outpatient Prescriptions on File Prior to Visit  Medication Sig Dispense Refill  . fexofenadine (ALLEGRA) 180 MG tablet Take 180 mg by mouth daily.    . fluticasone (FLONASE) 50 MCG/ACT nasal spray Place 2 sprays into the nose daily. 16 g 6  . ramipril (ALTACE) 5 MG capsule Take 1 capsule (5 mg total) by mouth daily. 90 capsule 3  . SUMAtriptan (IMITREX) 50 MG tablet TAKE ONE TABLET AS NEEDED FOR MIGRAINE. MAY REPEAT IN TWO HOURS IF NEEDED *MAX OF 2 TABLETS IN 24 HOURS* 9 tablet 1  . fluocinonide-emollient (LIDEX-E) 0.05 % cream Apply topically 2 (two) times daily. (Patient not taking: Reported on 01/19/2015) 30 g 0   No current facility-administered medications on file prior to visit.    Allergies  Allergen Reactions  . Penicillins     REACTION: unknown    Past Medical History  Diagnosis Date  . Abdominal pain, generalized   . Essential  hypertension, benign   . Gastroenteritis   . Migraine, unspecified, without mention of intractable migraine without mention of status migrainosus   . Acute upper respiratory infections of unspecified site   . Abnormal weight gain   . Routine general medical examination at a health care facility     History reviewed. No pertinent past surgical history.  History reviewed. No pertinent family history.  History   Social History  . Marital Status: Married    Spouse Name: N/A  . Number of Children: 1  . Years of Education: N/A   Occupational History  . hair stylist    Social History Main Topics  . Smoking status: Never Smoker   . Smokeless tobacco: Never Used  . Alcohol Use: No  . Drug Use: No  . Sexual Activity: Not on file   Other Topics Concern  . Not on file   Social History Narrative   Husband diagnosed with bladder cancer 07/2007.   The PMH, PSH, Social History, Family History, Medications, and allergies [facility], and have been updated if relevant.  Review of Systems  Constitutional: Positive for fatigue.  HENT: Negative.   Eyes: Negative.   Respiratory: Negative.   Cardiovascular: Negative.   Gastrointestinal:  Negative.   Endocrine: Negative.   Genitourinary: Negative.   Musculoskeletal: Negative.   Skin: Negative.   Allergic/Immunologic: Negative.   Neurological: Negative.   Hematological: Negative.   Psychiatric/Behavioral: Negative.   All other systems reviewed and are negative.      Objective:    BP 114/72 mmHg  Pulse 74  Temp(Src) 98.3 F (36.8 C) (Oral)  Ht  (1.626 m)  Wt 180 lb 8 oz (81.874 kg)  BMI 30.97 kg/m2   Physical Exam        Assessment & Plan:   Essential hypertension, benign  Other fatigue - Plan: CBC with Differential/Platelet, Comprehensive metabolic panel, TSH, Vitamin D, 25-hydroxy, Vitamin B12  Screening, lipid - Plan: Lipid panel No Follow-up on file.

## 2015-01-19 NOTE — Assessment & Plan Note (Signed)
Well controlled on current rx. No changes made.  Check renal function and electrolytes today.

## 2015-01-19 NOTE — Patient Instructions (Signed)
Good to see you. We will call you with your lab results.   

## 2015-01-26 MED ORDER — VITAMIN D (ERGOCALCIFEROL) 1.25 MG (50000 UNIT) PO CAPS: 50000.0000 [IU] | ORAL_CAPSULE | ORAL | Status: DC

## 2015-01-26 NOTE — Addendum Note (Signed)
Addended by: Desmond DikeKNIGHT, Danie Diehl H on: 01/26/2015 04:17 PM   Modules accepted: Orders

## 2015-02-05 ENCOUNTER — Other Ambulatory Visit: Payer: Self-pay | Admitting: Internal Medicine

## 2015-02-05 NOTE — Telephone Encounter (Signed)
Last filled 05/2014--last OV with was a f/u on 01/19/2015--please advise

## 2015-02-25 ENCOUNTER — Other Ambulatory Visit: Payer: Self-pay | Admitting: Family Medicine

## 2015-03-05 ENCOUNTER — Other Ambulatory Visit: Payer: Self-pay | Admitting: Family Medicine

## 2015-03-16 ENCOUNTER — Other Ambulatory Visit: Payer: Self-pay | Admitting: Family Medicine

## 2015-03-16 DIAGNOSIS — E559 Vitamin D deficiency, unspecified: Secondary | ICD-10-CM

## 2015-03-23 ENCOUNTER — Other Ambulatory Visit: Payer: BC Managed Care – PPO

## 2015-03-30 ENCOUNTER — Other Ambulatory Visit (INDEPENDENT_AMBULATORY_CARE_PROVIDER_SITE_OTHER): Payer: BC Managed Care – PPO

## 2015-03-30 DIAGNOSIS — E559 Vitamin D deficiency, unspecified: Secondary | ICD-10-CM | POA: Diagnosis not present

## 2015-03-30 LAB — VITAMIN D 25 HYDROXY (VIT D DEFICIENCY, FRACTURES): VITD: 60.51 ng/mL (ref 30.00–100.00)

## 2015-03-31 ENCOUNTER — Encounter: Payer: Self-pay | Admitting: Family Medicine

## 2015-07-13 ENCOUNTER — Ambulatory Visit (INDEPENDENT_AMBULATORY_CARE_PROVIDER_SITE_OTHER): Payer: BC Managed Care – PPO | Admitting: Family Medicine

## 2015-07-13 ENCOUNTER — Encounter: Payer: Self-pay | Admitting: Family Medicine

## 2015-07-13 VITALS — BP 114/62 | HR 72 | Temp 98.4°F | Wt 187.5 lb

## 2015-07-13 DIAGNOSIS — J069 Acute upper respiratory infection, unspecified: Secondary | ICD-10-CM

## 2015-07-13 MED ORDER — AZITHROMYCIN 250 MG PO TABS: ORAL_TABLET | ORAL | Status: DC

## 2015-07-13 MED ORDER — HYDROCOD POLST-CPM POLST ER 10-8 MG/5ML PO SUER: 5.0000 mL | Freq: Two times a day (BID) | ORAL | Status: DC | PRN

## 2015-07-13 NOTE — Progress Notes (Signed)
SUBJECTIVE:  Amber Baker is a 49 y.o. female who complains of coryza, congestion, productive cough, myalgias and headache for 8 days. She denies a history of anorexia, chest pain, chills and dizziness and denies a history of asthma. Patient denies smoke cigarettes.   Current Outpatient Prescriptions on File Prior to Visit  Medication Sig Dispense Refill  . aspirin 81 MG tablet Take 81 mg by mouth daily.    . Cholecalciferol (VITAMIN D3) 5000 UNITS CAPS Take by mouth daily.    . fexofenadine (ALLEGRA) 180 MG tablet Take 180 mg by mouth daily.    . fluticasone (FLONASE) 50 MCG/ACT nasal spray Place 2 sprays into the nose daily. 16 g 6  . Omega-3 Fatty Acids (OMEGA 3 PO) Take by mouth. Take one 1290 mg daily    . Probiotic Product (PROBIOTIC DAILY PO) Take by mouth daily.    . ramipril (ALTACE) 5 MG capsule TAKE ONE (1) CAPSULE BY MOUTH EACH DAY 90 capsule 1  . SUMAtriptan (IMITREX) 50 MG tablet TAKE ONE TABLET AS NEEDED FOR MIGRAINE. MAY REPEAT IN TWO HOURS IF NEEDED *MAX OF 2 TABLETS IN 24 HOURS* 10 tablet 3  . vitamin B-12 (CYANOCOBALAMIN) 1000 MCG tablet Take 1,000 mcg by mouth daily.     No current facility-administered medications on file prior to visit.    Allergies  Allergen Reactions  . Penicillins     REACTION: unknown    Past Medical History  Diagnosis Date  . Abdominal pain, generalized   . Essential hypertension, benign   . Gastroenteritis   . Migraine, unspecified, without mention of intractable migraine without mention of status migrainosus   . Acute upper respiratory infections of unspecified site   . Abnormal weight gain   . Routine general medical examination at a health care facility     History reviewed. No pertinent past surgical history.  History reviewed. No pertinent family history.  Social History   Social History  . Marital Status: Married    Spouse Name: N/A  . Number of Children: 1  . Years of Education: N/A   Occupational History  . hair  stylist    Social History Main Topics  . Smoking status: Never Smoker   . Smokeless tobacco: Never Used  . Alcohol Use: No  . Drug Use: No  . Sexual Activity: Not on file   Other Topics Concern  . Not on file   Social History Narrative   Husband diagnosed with bladder cancer 07/2007.   The PMH, PSH, Social History, Family History, Medications, and allergies have been reviewed in [facility], and have been updated if relevant.  OBJECTIVE: BP 114/62 mmHg  Pulse 72  Temp(Src) 98.4 F (36.9 C) (Oral)  Wt 187 lb 8 oz (85.049 kg)  SpO2 96%  LMP 03/24/2014  She appears well, vital signs are as noted. Ears normal.  Throat and pharynx normal.  Neck supple. No adenopathy in the neck. Nose is congested. Sinuses non tender. Wheeze RLL  ASSESSMENT:  bronchitis  PLAN: Given duration and progression of symptoms, will treat for bacterial process with Zpack. Tussionex prn cough- aware of sedation precautions.  Symptomatic therapy suggested: push fluids, rest and return office visit prn if symptoms persist or worsen .Call or return to clinic prn if these symptoms worsen or fail to improve as anticipated.

## 2015-08-11 ENCOUNTER — Other Ambulatory Visit: Payer: Self-pay | Admitting: Family Medicine

## 2015-10-06 ENCOUNTER — Telehealth: Payer: Self-pay | Admitting: Family

## 2015-10-06 DIAGNOSIS — J019 Acute sinusitis, unspecified: Secondary | ICD-10-CM

## 2015-10-06 DIAGNOSIS — B9689 Other specified bacterial agents as the cause of diseases classified elsewhere: Secondary | ICD-10-CM

## 2015-10-06 MED ORDER — DOXYCYCLINE HYCLATE 100 MG PO TABS
100.0000 mg | ORAL_TABLET | Freq: Two times a day (BID) | ORAL | Status: DC
Start: 2015-10-06 — End: 2015-12-14

## 2015-10-06 NOTE — Progress Notes (Signed)

## 2015-12-01 ENCOUNTER — Other Ambulatory Visit: Payer: Self-pay | Admitting: Family Medicine

## 2015-12-14 ENCOUNTER — Ambulatory Visit (INDEPENDENT_AMBULATORY_CARE_PROVIDER_SITE_OTHER): Payer: BC Managed Care – PPO | Admitting: Family Medicine

## 2015-12-14 VITALS — BP 142/86 | HR 79 | Temp 98.0°F | Wt 189.5 lb

## 2015-12-14 DIAGNOSIS — H01003 Unspecified blepharitis right eye, unspecified eyelid: Secondary | ICD-10-CM

## 2015-12-14 MED ORDER — POLYMYXIN B-TRIMETHOPRIM 10000-0.1 UNIT/ML-% OP SOLN: 1.0000 [drp] | OPHTHALMIC | Status: DC

## 2015-12-14 NOTE — Progress Notes (Signed)
Pre visit review using our clinic review tool, if applicable. No additional management support is needed unless otherwise documented below in the visit note. 

## 2015-12-14 NOTE — Assessment & Plan Note (Signed)
New- advised supportive care as per AVS. eRx also sent for polytrim drops. Call or return to clinic prn if these symptoms worsen or fail to improve as anticipated. The patient indicates understanding of these issues and agrees with the plan.

## 2015-12-14 NOTE — Progress Notes (Signed)
Subjective:   Patient ID: Amber Baker, female    DOB: 10/08/1966, 50 y.o.   MRN: [id number]  Amber Baker is a pleasant 50 y.o. year old female who presents to clinic today with Belepharitis  on 12/14/2015  HPI:  Two days of right eye lid puffiness and eye irritation.  Her eye was matted shut this morning.  No blurred vision or eye pain but her eye is "irritated."   Current Outpatient Prescriptions on File Prior to Visit  Medication Sig Dispense Refill  . aspirin 81 MG tablet Take 81 mg by mouth daily.    . fexofenadine (ALLEGRA) 180 MG tablet Take 180 mg by mouth daily.    . fluticasone (FLONASE) 50 MCG/ACT nasal spray Place 2 sprays into the nose daily. 16 g 6  . Omega-3 Fatty Acids (OMEGA 3 PO) Take by mouth. Take one 1290 mg daily    . Probiotic Product (PROBIOTIC DAILY PO) Take by mouth daily.    . ramipril (ALTACE) 5 MG capsule TAKE ONE CAPSULE BY MOUTH DAILY 90 capsule 1  . SUMAtriptan (IMITREX) 50 MG tablet TAKE ONE TABLET AS NEEDED FOR MIGRAINE. MAY REPEAT IN TWO HOURS IF NEEDED *MAX OF 2 TABLETS IN 24 HOURS* 10 tablet 3   No current facility-administered medications on file prior to visit.    Allergies  Allergen Reactions  . Penicillins     REACTION: unknown    Past Medical History  Diagnosis Date  . Abdominal pain, generalized   . Essential hypertension, benign   . Gastroenteritis   . Migraine, unspecified, without mention of intractable migraine without mention of status migrainosus   . Acute upper respiratory infections of unspecified site   . Abnormal weight gain   . Routine general medical examination at a health care facility     No past surgical history on file.  No family history on file.  Social History   Social History  . Marital Status: Married    Spouse Name: N/A  . Number of Children: 1  . Years of Education: N/A   Occupational History  . hair stylist    Social History Main Topics  . Smoking status: Never Smoker   . Smokeless  tobacco: Never Used  . Alcohol Use: No  . Drug Use: No  . Sexual Activity: Not on file   Other Topics Concern  . Not on file   Social History Narrative   Husband diagnosed with bladder cancer 07/2007.   The PMH, PSH, Social History, Family History, Medications, and allergies [facility], and have been updated if relevant.  Review of Systems  Constitutional: Negative.   Eyes: Positive for redness and itching. Negative for photophobia, pain, discharge and visual disturbance.  All other systems reviewed and are negative.      Objective:    BP 142/86 mmHg  Pulse 79  Temp(Src) 98 F (36.7 C) (Oral)  Wt 189 lb 8 oz (85.957 kg)  SpO2 97%  LMP 03/24/2014   Physical Exam  Constitutional: She is oriented to person, place, and time. She appears well-developed and well-nourished. No distress.  HENT:  Head: Normocephalic.  Eyes: Right eye exhibits discharge. Left eye exhibits no discharge. Right conjunctiva is injected. Left conjunctiva is not injected.  Cardiovascular: Normal rate.   Pulmonary/Chest: Effort normal.  Musculoskeletal: Normal range of motion.  Neurological: She is alert and oriented to person, place, and time. No cranial nerve deficit.  Skin: Skin is warm and dry. She  is not diaphoretic.  Psychiatric: She has a normal mood and affect. Her behavior is normal. Judgment and thought content normal.  Nursing note and vitals reviewed.         Assessment & Plan:   Blepharitis of right eyelid No Follow-up on file.

## 2015-12-14 NOTE — Patient Instructions (Signed)
Blepharitis Blepharitis is inflammation of the eyelids. Blepharitis may happen with:  Reddish, scaly skin around the scalp and eyebrows.  Burning or itching of the eyelids.  Eye discharge at night that causes the eyelashes to stick together in the morning.  Eyelashes that fall out.  Sensitivity to light. HOME CARE INSTRUCTIONS Pay attention to any changes in how you look or feel. Follow these instructions to help with your condition: Keeping Clean  Wash your hands often.  Wash your eyelids with warm water or with warm water that is mixed with a small amount of baby shampoo. Do this two times per day or as often as needed.  Wash your face and eyebrows at least once a day.  Use a clean towel each time you dry your eyelids. Do not use this towel to clean or dry other areas of your body. Do not share your towel with anyone. General Instructions  Avoid wearing makeup until you get better. Do not share makeup with anyone.  Avoid rubbing your eyes.  Apply warm compresses to your eyes 2 times per day for 10 minutes at a time, or as told by your health care provider.  If you were prescribed an antibiotic ointment or steroid drops, apply or use the medicine as told by your health care provider. Do not stop using the medicine even if you feel better.  Keep all follow-up visits as told by your health care provider. This is important. SEEK MEDICAL CARE IF:  Your eyelids feel hot.  You have blisters or a rash on your eyelids.  The condition does not go away in 2-4 days.  The inflammation gets worse. SEEK IMMEDIATE MEDICAL CARE IF:  You have pain or redness that gets worse or spreads to other parts of your face.  Your vision changes.  You have pain when looking at lights or moving objects.  You have a fever.   This information is not intended to replace advice given to you by your health care provider. Make sure you discuss any questions you have with your health care  provider.   Document Released: 09/23/2000 Document Revised: 06/17/2015 Document Reviewed: 01/19/2015 Elsevier Interactive Patient Education 2016 [employer]  

## 2015-12-16 ENCOUNTER — Encounter: Payer: Self-pay | Admitting: Family Medicine

## 2016-05-23 ENCOUNTER — Ambulatory Visit (INDEPENDENT_AMBULATORY_CARE_PROVIDER_SITE_OTHER): Payer: BC Managed Care – PPO | Admitting: Family Medicine

## 2016-05-23 ENCOUNTER — Other Ambulatory Visit (HOSPITAL_COMMUNITY)
Admission: RE | Admit: 2016-05-23 | Discharge: 2016-05-23 | Disposition: A | Discharge status: Home / Self Care | Payer: BC Managed Care – PPO | Source: Ambulatory Visit | Attending: Family Medicine | Admitting: Family Medicine

## 2016-05-23 ENCOUNTER — Encounter: Payer: Self-pay | Admitting: Family Medicine

## 2016-05-23 VITALS — BP 106/74 | HR 90 | Temp 98.0°F | Wt 166.5 lb

## 2016-05-23 DIAGNOSIS — Z01419 Encounter for gynecological examination (general) (routine) without abnormal findings: Secondary | ICD-10-CM | POA: Diagnosis not present

## 2016-05-23 DIAGNOSIS — Z1151 Encounter for screening for human papillomavirus (HPV): Secondary | ICD-10-CM | POA: Diagnosis present

## 2016-05-23 DIAGNOSIS — N760 Acute vaginitis: Secondary | ICD-10-CM | POA: Diagnosis present

## 2016-05-23 DIAGNOSIS — Z0000 Encounter for general adult medical examination without abnormal findings: Secondary | ICD-10-CM | POA: Diagnosis not present

## 2016-05-23 DIAGNOSIS — Z113 Encounter for screening for infections with a predominantly sexual mode of transmission: Secondary | ICD-10-CM | POA: Insufficient documentation

## 2016-05-23 DIAGNOSIS — I10 Essential (primary) hypertension: Secondary | ICD-10-CM

## 2016-05-23 LAB — CBC WITH DIFFERENTIAL/PLATELET
BASOS PCT: 0.5 % (ref 0.0–3.0)
Basophils Absolute: 0 10*3/uL (ref 0.0–0.1)
EOS ABS: 0.1 10*3/uL (ref 0.0–0.7)
EOS PCT: 0.9 % (ref 0.0–5.0)
HCT: 41.1 % (ref 36.0–46.0)
Hemoglobin: 13.9 g/dL (ref 12.0–15.0)
LYMPHS ABS: 1.9 10*3/uL (ref 0.7–4.0)
LYMPHS PCT: 35.4 % (ref 12.0–46.0)
MCHC: 33.9 g/dL (ref 30.0–36.0)
MCV: 87.8 fl (ref 78.0–100.0)
MONO ABS: 0.6 10*3/uL (ref 0.1–1.0)
MONOS PCT: 11.6 % (ref 3.0–12.0)
NEUTROS ABS: 2.8 10*3/uL (ref 1.4–7.7)
Neutrophils Relative %: 51.6 % (ref 43.0–77.0)
PLATELETS: 316 10*3/uL (ref 150.0–400.0)
RBC: 4.68 Mil/uL (ref 3.87–5.11)
RDW: 13.7 % (ref 11.5–15.5)
WBC: 5.4 10*3/uL (ref 4.0–10.5)

## 2016-05-23 LAB — COMPREHENSIVE METABOLIC PANEL
ALT: 16 U/L (ref 0–35)
AST: 16 U/L (ref 0–37)
Albumin: 4.5 g/dL (ref 3.5–5.2)
Alkaline Phosphatase: 73 U/L (ref 39–117)
BUN: 16 mg/dL (ref 6–23)
CHLORIDE: 107 meq/L (ref 96–112)
CO2: 28 meq/L (ref 19–32)
CREATININE: 0.81 mg/dL (ref 0.40–1.20)
Calcium: 9.7 mg/dL (ref 8.4–10.5)
GFR: 79.67 mL/min (ref >60.00)
Glucose, Bld: 95 mg/dL (ref 70–99)
POTASSIUM: 4.4 meq/L (ref 3.5–5.1)
SODIUM: 141 meq/L (ref 135–145)
Total Bilirubin: 0.3 mg/dL (ref 0.2–1.2)
Total Protein: 7.4 g/dL (ref 6.0–8.3)

## 2016-05-23 LAB — LIPID PANEL
CHOLESTEROL: 166 mg/dL (ref 0–200)
HDL: 53.5 mg/dL (ref >39.00)
LDL CALC: 101 mg/dL — AB (ref 0–99)
NONHDL: 112.46
Total CHOL/HDL Ratio: 3
Triglycerides: 58 mg/dL (ref 0.0–149.0)
VLDL: 11.6 mg/dL (ref 0.0–40.0)

## 2016-05-23 LAB — TSH: TSH: 1.16 u[IU]/mL (ref 0.35–4.50)

## 2016-05-23 NOTE — Assessment & Plan Note (Signed)
Pap smear done today. Pt to call to schedule her mammogram- numbers to breast center given to pt.

## 2016-05-23 NOTE — Progress Notes (Signed)
Subjective:   Patient ID: Amber Baker, female    DOB: 01/16/1966, 50 y.o.   MRN: [id number]  Amber Baker is a pleasant 50 y.o. year old female who presents to clinic today with Follow-up (BP); Gynecologic Exam; and well woman  on 05/23/2016  HPI:  Well woman-  Last pap smear 02/14/13.  NO h/o abnormal pap smears.  Mammogram 03/07/13.  Agrees to lab work today.  Has lost 20 pounds with a Systems analystpersonal trainer and diet.  Asking if she can stop her BP medication.  Has BP cuff at home.  Has been taking low dose Altace.  Denies any HA, blurred vision, LE edema, CP or SOB.  Current Outpatient Prescriptions on File Prior to Visit  Medication Sig Dispense Refill  . fexofenadine (ALLEGRA) 180 MG tablet Take 180 mg by mouth daily.    . Omega-3 Fatty Acids (OMEGA 3 PO) Take by mouth. Take one 1290 mg daily    . ramipril (ALTACE) 5 MG capsule TAKE ONE CAPSULE BY MOUTH DAILY 90 capsule 1  . SUMAtriptan (IMITREX) 50 MG tablet TAKE ONE TABLET AS NEEDED FOR MIGRAINE. MAY REPEAT IN TWO HOURS IF NEEDED *MAX OF 2 TABLETS IN 24 HOURS* 10 tablet 3   No current facility-administered medications on file prior to visit.     Allergies  Allergen Reactions  . Penicillins     REACTION: unknown    Past Medical History:  Diagnosis Date  . Abdominal pain, generalized   . Abnormal weight gain   . Acute upper respiratory infections of unspecified site   . Essential hypertension, benign   . Gastroenteritis   . Migraine, unspecified, without mention of intractable migraine without mention of status migrainosus   . Routine general medical examination at a health care facility     No past surgical history on file.  No family history on file.  Social History   Social History  . Marital status: Married    Spouse name: N/A  . Number of children: 1  . Years of education: N/A   Occupational History  . hair stylist    Social History Main Topics  . Smoking status: Never Smoker  . Smokeless tobacco:  Never Used  . Alcohol use No  . Drug use: No  . Sexual activity: Not on file   Other Topics Concern  . Not on file   Social History Narrative   Husband diagnosed with bladder cancer 07/2007.   The PMH, PSH, Social History, Family History, Medications, and allergies [facility], and have been updated if relevant.  Review of Systems  Constitutional: Negative.   HENT: Negative.   Eyes: Negative.   Respiratory: Negative.   Cardiovascular: Negative.   Gastrointestinal: Negative.   Endocrine: Negative.   Genitourinary: Negative.   Musculoskeletal: Negative.   Skin: Negative.   Allergic/Immunologic: Negative.   Neurological: Negative.   Hematological: Negative.   Psychiatric/Behavioral: Negative.   All other systems reviewed and are negative.      Objective:    BP 106/74   Pulse 90   Temp 98 F (36.7 C) (Oral)   Wt 166 lb 8 oz (75.5 kg)   LMP 03/24/2014   SpO2 97%   BMI 28.58 kg/m   Wt Readings from Last 3 Encounters:  05/23/16 166 lb 8 oz (75.5 kg)  12/14/15 189 lb 8 oz (86 kg)  07/13/15 187 lb 8 oz (85 kg)    Physical Exam   General:  Well-developed,well-nourished,in no  acute distress; alert,appropriate and cooperative throughout examination Head:  normocephalic and atraumatic.   Eyes:  vision grossly intact, pupils equal, pupils round, and pupils reactive to light.   Ears:  R ear normal and L ear normal.   Nose:  no external deformity.   Mouth:  good dentition.   Neck:  No deformities, masses, or tenderness noted. Breasts:  No mass, nodules, thickening, tenderness, bulging, retraction, inflamation, nipple discharge or skin changes noted.   Lungs:  Normal respiratory effort, chest expands symmetrically. Lungs are clear to auscultation, no crackles or wheezes. Heart:  Normal rate and regular rhythm. S1 and S2 normal without gallop, murmur, click, rub or other extra sounds. Abdomen:  Bowel sounds positive,abdomen soft and non-tender without  masses, organomegaly or hernias noted. Rectal:  no external abnormalities.   Genitalia:  Pelvic Exam:        External: normal female genitalia without lesions or masses        Vagina: normal without lesions or masses        Cervix: normal without lesions or masses        Adnexa: normal bimanual exam without masses or fullness        Uterus: normal by palpation        Pap smear: performed Msk:  No deformity or scoliosis noted of thoracic or lumbar spine.   Extremities:  No clubbing, cyanosis, edema, or deformity noted with normal full range of motion of all joints.   Neurologic:  alert & oriented X3 and gait normal.   Skin:  Intact without suspicious lesions or rashes Cervical Nodes:  No lymphadenopathy noted Axillary Nodes:  No palpable lymphadenopathy Psych:  Cognition and judgment appear intact. Alert and cooperative with normal attention span and concentration. No apparent delusions, illusions, hallucinations       Assessment & Plan:   Essential hypertension, benign  Encounter for routine gynecological examination  Well woman exam No Follow-up on file.

## 2016-05-23 NOTE — Addendum Note (Signed)
Addended by: Desmond DikeKNIGHT, Davius Goudeau H on: 05/23/2016 10:56 AM   Modules accepted: Orders

## 2016-05-23 NOTE — Progress Notes (Signed)
Pre visit review using our clinic review tool, if applicable. No additional management support is needed unless otherwise documented below in the visit note. 

## 2016-05-23 NOTE — Assessment & Plan Note (Signed)
Reviewed preventive care protocols, scheduled due services, and updated immunizations Discussed nutrition, exercise, diet, and healthy lifestyle.  

## 2016-05-23 NOTE — Assessment & Plan Note (Signed)
Well controlled with weight loss. D/c Altace and she will keep me updated with her BP readings.

## 2016-05-23 NOTE — Patient Instructions (Signed)
Great to see you. Please schedule your mammogram.  We will call you with your results and you can view them online.

## 2016-05-25 ENCOUNTER — Encounter: Payer: Self-pay | Admitting: *Deleted

## 2016-05-25 LAB — CYTOLOGY - PAP

## 2016-05-25 LAB — CERVICOVAGINAL ANCILLARY ONLY
BACTERIAL VAGINITIS: POSITIVE — AB
Candida vaginitis: NEGATIVE

## 2016-05-27 ENCOUNTER — Telehealth: Payer: Self-pay | Admitting: Family Medicine

## 2016-05-27 LAB — CERVICOVAGINAL ANCILLARY ONLY: Herpes: NEGATIVE

## 2016-05-27 NOTE — Telephone Encounter (Signed)
Pt returned call regarding labs . Please call [phone] Thanks

## 2016-05-27 NOTE — Telephone Encounter (Signed)
Patient was given her results 05/23/16. The flagyl 500 mg twice daily x 7 days, # 14 with no refills was never sent for patient. Would you like for me to place this order.

## 2016-05-28 ENCOUNTER — Other Ambulatory Visit: Payer: Self-pay | Admitting: Family Medicine

## 2016-05-28 MED ORDER — METRONIDAZOLE 500 MG PO TABS: 500.0000 mg | ORAL_TABLET | Freq: Two times a day (BID) | ORAL | 0 refills | Status: DC

## 2016-05-28 NOTE — Telephone Encounter (Signed)
Yes I will send it in now

## 2016-06-03 ENCOUNTER — Encounter: Payer: Self-pay | Admitting: Family Medicine

## 2016-07-04 ENCOUNTER — Encounter: Payer: Self-pay | Admitting: Family Medicine

## 2016-07-04 ENCOUNTER — Ambulatory Visit (INDEPENDENT_AMBULATORY_CARE_PROVIDER_SITE_OTHER): Payer: BC Managed Care – PPO | Admitting: Family Medicine

## 2016-07-04 DIAGNOSIS — L0291 Cutaneous abscess, unspecified: Secondary | ICD-10-CM | POA: Diagnosis not present

## 2016-07-04 DIAGNOSIS — L0390 Cellulitis, unspecified: Secondary | ICD-10-CM | POA: Diagnosis not present

## 2016-07-04 MED ORDER — SULFAMETHOXAZOLE-TRIMETHOPRIM 800-160 MG PO TABS: 1.0000 | ORAL_TABLET | Freq: Two times a day (BID) | ORAL | 0 refills | Status: DC

## 2016-07-04 NOTE — Progress Notes (Signed)
Subjective:   Patient ID: Amber Baker, female    DOB: 12/27/1965, 50 y.o.   MRN: [id number]  Amber Catholicammy T Gowens is a pleasant 50 y.o. year old female who presents to clinic today with Wound Infection (right foot)  on 07/04/2016  HPI:  Right foot infection-  Has had a sore area between her 4th and 5th left toe- started with shoe she was wearing.  Over the weekend, became red, sore to touch, swollen.  No fevers, chills, nausea or vomiting.  Current Outpatient Prescriptions on File Prior to Visit  Medication Sig Dispense Refill  . fexofenadine (ALLEGRA) 180 MG tablet Take 180 mg by mouth daily.    . Omega-3 Fatty Acids (OMEGA 3 PO) Take by mouth. Take one 1290 mg daily    . ramipril (ALTACE) 5 MG capsule TAKE ONE CAPSULE BY MOUTH DAILY 90 capsule 1  . SUMAtriptan (IMITREX) 50 MG tablet TAKE ONE TABLET AS NEEDED FOR MIGRAINE. MAY REPEAT IN TWO HOURS IF NEEDED *MAX OF 2 TABLETS IN 24 HOURS* 10 tablet 2   No current facility-administered medications on file prior to visit.     Allergies  Allergen Reactions  . Penicillins     REACTION: unknown    Past Medical History:  Diagnosis Date  . Abdominal pain, generalized   . Abnormal weight gain   . Acute upper respiratory infections of unspecified site   . Essential hypertension, benign   . Gastroenteritis   . Migraine, unspecified, without mention of intractable migraine without mention of status migrainosus   . Routine general medical examination at a health care facility     No past surgical history on file.  No family history on file.  Social History   Social History  . Marital status: Married    Spouse name: N/A  . Number of children: 1  . Years of education: N/A   Occupational History  . hair stylist    Social History Main Topics  . Smoking status: Never Smoker  . Smokeless tobacco: Never Used  . Alcohol use No  . Drug use: No  . Sexual activity: Not on file   Other Topics Concern  . Not on file   Social  History Narrative   Husband diagnosed with bladder cancer 07/2007.   The PMH, PSH, Social History, Family History, Medications, and allergies [facility], and have been updated if relevant.  Review of Systems  Constitutional: Negative.   Skin: Positive for color change and wound.  All other systems reviewed and are negative.      Objective:    BP 140/90   Pulse 68   Temp 98.1 F (36.7 C)   Wt 168 lb 12.8 oz (76.6 kg)   LMP 03/24/2014   SpO2 98%   BMI 28.97 kg/m    Physical Exam  Constitutional: She is oriented to person, place, and time. She appears well-developed and well-nourished. No distress.  HENT:  Head: Normocephalic.  Eyes: Conjunctivae are normal.  Cardiovascular: Normal rate.   Pulmonary/Chest: Effort normal.  Neurological: She is alert and oriented to person, place, and time. No cranial nerve deficit.  Skin: She is not diaphoretic.  Interdigit space between right 4th and 5th toe- red, warm to touch, abscess/nonfluctuant  Psychiatric: She has a normal mood and affect. Her behavior is normal. Judgment and thought content normal.  Nursing note and vitals reviewed.         Assessment & Plan:   Cellulitis and abscess No  Follow-up on file.

## 2016-07-04 NOTE — Assessment & Plan Note (Signed)
New- treat with 10 day course of Bactrim twice daily. Call or return to clinic prn if these symptoms worsen or fail to improve as anticipated. The patient indicates understanding of these issues and agrees with the plan.

## 2016-07-04 NOTE — Patient Instructions (Addendum)
Great to see you. Please take bactrim as directed- 1 tablet twice daily for 10 days.   Please soak your foot in epsom salts and keep me updated.

## 2016-07-25 ENCOUNTER — Other Ambulatory Visit: Payer: Self-pay | Admitting: Family Medicine

## 2016-07-25 ENCOUNTER — Encounter: Payer: Self-pay | Admitting: Family Medicine

## 2016-07-25 ENCOUNTER — Ambulatory Visit (INDEPENDENT_AMBULATORY_CARE_PROVIDER_SITE_OTHER): Payer: BC Managed Care – PPO | Admitting: Family Medicine

## 2016-07-25 ENCOUNTER — Ambulatory Visit: Payer: BC Managed Care – PPO | Admitting: Family Medicine

## 2016-07-25 DIAGNOSIS — L03019 Cellulitis of unspecified finger: Secondary | ICD-10-CM | POA: Insufficient documentation

## 2016-07-25 DIAGNOSIS — L03011 Cellulitis of right finger: Secondary | ICD-10-CM | POA: Diagnosis not present

## 2016-07-25 DIAGNOSIS — R159 Full incontinence of feces: Secondary | ICD-10-CM

## 2016-07-25 LAB — CBC WITH DIFFERENTIAL/PLATELET
BASOS ABS: 0 10*3/uL (ref 0.0–0.1)
Basophils Relative: 0.7 % (ref 0.0–3.0)
Eosinophils Absolute: 0.1 10*3/uL (ref 0.0–0.7)
Eosinophils Relative: 1.6 % (ref 0.0–5.0)
HCT: 41.9 % (ref 36.0–46.0)
Hemoglobin: 14.1 g/dL (ref 12.0–15.0)
LYMPHS ABS: 1.7 10*3/uL (ref 0.7–4.0)
Lymphocytes Relative: 35.5 % (ref 12.0–46.0)
MCHC: 33.5 g/dL (ref 30.0–36.0)
MCV: 88.5 fl (ref 78.0–100.0)
MONO ABS: 0.5 10*3/uL (ref 0.1–1.0)
Monocytes Relative: 11.5 % (ref 3.0–12.0)
NEUTROS PCT: 50.7 % (ref 43.0–77.0)
Neutro Abs: 2.4 10*3/uL (ref 1.4–7.7)
Platelets: 317 10*3/uL (ref 150.0–400.0)
RBC: 4.74 Mil/uL (ref 3.87–5.11)
RDW: 13.2 % (ref 11.5–15.5)
WBC: 4.7 10*3/uL (ref 4.0–10.5)

## 2016-07-25 LAB — HEMOGLOBIN A1C: HEMOGLOBIN A1C: 5.5 % (ref 4.6–6.5)

## 2016-07-25 LAB — COMPREHENSIVE METABOLIC PANEL
ALT: 20 U/L (ref 0–35)
AST: 19 U/L (ref 0–37)
Albumin: 4.6 g/dL (ref 3.5–5.2)
Alkaline Phosphatase: 70 U/L (ref 39–117)
BILIRUBIN TOTAL: 0.4 mg/dL (ref 0.2–1.2)
BUN: 16 mg/dL (ref 6–23)
CO2: 28 meq/L (ref 19–32)
Calcium: 9.6 mg/dL (ref 8.4–10.5)
Chloride: 106 mEq/L (ref 96–112)
Creatinine, Ser: 0.78 mg/dL (ref 0.40–1.20)
GFR: 83.16 mL/min (ref >60.00)
GLUCOSE: 93 mg/dL (ref 70–99)
Potassium: 4.5 mEq/L (ref 3.5–5.1)
Sodium: 141 mEq/L (ref 135–145)
Total Protein: 7.5 g/dL (ref 6.0–8.3)

## 2016-07-25 LAB — URIC ACID: Uric Acid, Serum: 4.2 mg/dL (ref 2.4–7.0)

## 2016-07-25 NOTE — Addendum Note (Signed)
Addended by: Alvina ChouWALSH, TERRI J on: 07/25/2016 10:12 AM   Modules accepted: Orders

## 2016-07-25 NOTE — Progress Notes (Signed)
Pre visit review using our clinic review tool, if applicable. No additional management support is needed unless otherwise documented below in the visit note. 

## 2016-07-25 NOTE — Progress Notes (Signed)
Subjective:   Patient ID: Amber Baker, female    DOB: January 13, 1966, 50 y.o.   MRN: [id number]  Amber Baker is a pleasant 50 y.o. year old female who presents to clinic today with Follow-up  on 07/25/2016  HPI:  Patient is concerned that there may be something wrong with her immune system.  Treated for BV in August, then had diarrhea with fecal incontinence after a mission trip.  Most recently, she has been having issues with a chronic paronychia.  She is concerned that these all point to an issue with her blood sugar or immune system.  Diarrhea has resolved.  Has not vaginal discharge or complaints.  Paronychia is not currently painful or red.  Current Outpatient Prescriptions on File Prior to Visit  Medication Sig Dispense Refill  . fexofenadine (ALLEGRA) 180 MG tablet Take 180 mg by mouth daily.    . Omega-3 Fatty Acids (OMEGA 3 PO) Take by mouth. Take one 1290 mg daily    . ramipril (ALTACE) 5 MG capsule TAKE ONE CAPSULE BY MOUTH DAILY 90 capsule 1  . SUMAtriptan (IMITREX) 50 MG tablet TAKE ONE TABLET AS NEEDED FOR MIGRAINE. MAY REPEAT IN TWO HOURS IF NEEDED *MAX OF 2 TABLETS IN 24 HOURS* 10 tablet 2   No current facility-administered medications on file prior to visit.     Allergies  Allergen Reactions  . Penicillins     REACTION: unknown    Past Medical History:  Diagnosis Date  . Abdominal pain, generalized   . Abnormal weight gain   . Acute upper respiratory infections of unspecified site   . Essential hypertension, benign   . Gastroenteritis   . Migraine, unspecified, without mention of intractable migraine without mention of status migrainosus   . Routine general medical examination at a health care facility     No past surgical history on file.  No family history on file.  Social History   Social History  . Marital status: Married    Spouse name: N/A  . Number of children: 1  . Years of education: N/A   Occupational History  . hair stylist     Social History Main Topics  . Smoking status: Never Smoker  . Smokeless tobacco: Never Used  . Alcohol use No  . Drug use: No  . Sexual activity: Not on file   Other Topics Concern  . Not on file   Social History Narrative   Husband diagnosed with bladder cancer 07/2007.   The PMH, PSH, Social History, Family History, Medications, and allergies have been reviewed in [facility], and have been updated if relevant.   Review of Systems  Constitutional: Positive for fatigue.  HENT: Negative.   Respiratory: Negative.   Cardiovascular: Negative.   Gastrointestinal: Negative.   Endocrine: Negative.   Genitourinary: Negative.   Musculoskeletal: Negative.   Neurological: Negative.   Hematological: Negative.   Psychiatric/Behavioral: Negative.   All other systems reviewed and are negative.      Objective:    BP 130/80   Pulse 72   Temp 98 F (36.7 C) (Oral)   Wt 171 lb 8 oz (77.8 kg)   LMP 03/24/2014   SpO2 98%   BMI 29.44 kg/m    Physical Exam  Constitutional: She is oriented to person, place, and time. She appears well-developed and well-nourished. No distress.  HENT:  Head: Normocephalic.  Eyes: Conjunctivae are normal.  Cardiovascular: Normal rate.   Pulmonary/Chest: Effort normal.  Musculoskeletal: Normal range of motion.  Neurological: She is alert and oriented to person, place, and time. No cranial nerve deficit.  Skin: Skin is warm and dry. She is not diaphoretic.  Psychiatric: She has a normal mood and affect. Her behavior is normal. Judgment and thought content normal.  Nursing note and vitals reviewed.         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

## 2016-07-25 NOTE — Assessment & Plan Note (Signed)
>  15 minutes spent in face to face time with patient, >50% spent in counselling or coordination of care Reassured patient that I do not think these things are related but we can certainly check blood work today. The patient indicates understanding of these issues and agrees with the plan. Orders Placed This Encounter  Procedures  . CBC with Differential/Platelet  . Hemoglobin A1c  . Comprehensive metabolic panel  . Uric Acid

## 2016-08-25 ENCOUNTER — Encounter: Payer: Self-pay | Admitting: Family Medicine

## 2016-08-29 ENCOUNTER — Other Ambulatory Visit: Payer: Self-pay | Admitting: Family Medicine

## 2016-08-29 ENCOUNTER — Other Ambulatory Visit (INDEPENDENT_AMBULATORY_CARE_PROVIDER_SITE_OTHER): Payer: BC Managed Care – PPO

## 2016-08-29 DIAGNOSIS — R5383 Other fatigue: Secondary | ICD-10-CM | POA: Diagnosis not present

## 2016-08-29 LAB — VITAMIN D 25 HYDROXY (VIT D DEFICIENCY, FRACTURES): VITD: 32.54 ng/mL (ref 30.00–100.00)

## 2016-08-29 LAB — T4, FREE: Free T4: 0.82 ng/dL (ref 0.60–1.60)

## 2016-08-29 LAB — TSH: TSH: 0.99 u[IU]/mL (ref 0.35–4.50)

## 2016-08-29 LAB — T3, FREE: T3, Free: 3.5 pg/mL (ref 2.3–4.2)

## 2016-08-29 MED ORDER — CYANOCOBALAMIN 1000 MCG/ML IJ SOLN: 1000.0000 ug | Freq: Once | INTRAMUSCULAR | Status: DC

## 2016-09-06 ENCOUNTER — Encounter: Payer: Self-pay | Admitting: Family Medicine

## 2016-10-25 ENCOUNTER — Other Ambulatory Visit: Payer: Self-pay | Admitting: Family Medicine

## 2016-10-25 NOTE — Telephone Encounter (Signed)
Pt has not had any recent HA f/u. pls advise

## 2016-10-25 NOTE — Telephone Encounter (Signed)
Refill one time only.  Needs OV for further refills. 

## 2016-11-28 ENCOUNTER — Other Ambulatory Visit: Payer: Self-pay | Admitting: Family Medicine

## 2017-05-05 ENCOUNTER — Other Ambulatory Visit: Payer: Self-pay | Admitting: Family Medicine

## 2017-05-05 NOTE — Telephone Encounter (Signed)
Last refill 11/29/16  Last OV 05/23/16 Ok to refill?

## 2017-06-21 ENCOUNTER — Other Ambulatory Visit: Payer: Self-pay | Admitting: Family Medicine

## 2017-10-11 ENCOUNTER — Telehealth: Payer: Self-pay | Admitting: Family Medicine

## 2017-10-11 MED ORDER — SUMATRIPTAN SUCCINATE 50 MG PO TABS: ORAL_TABLET | ORAL | 0 refills | Status: DC

## 2017-10-11 NOTE — Telephone Encounter (Signed)
eRX refilled one time only.

## 2017-10-11 NOTE — Telephone Encounter (Signed)
Copied from CRM [phone]. Topic: Quick Communication - Rx Refill/Question >> Oct 11, 2017 10:41 AM Landry MellowFoltz, Melissa J wrote: Has the patient contacted their pharmacy? No.   (Agent: If no, request that the patient contact the pharmacy for the refill.)   Preferred Pharmacy (with phone number or street name): [facility] - pt would like refill on SUMAtriptan (IMITREX) 50 MG tablet. [facility].     Agent: Please be advised that RX refills may take up to 3 business days. We ask that you follow-up with your pharmacy.

## 2017-10-11 NOTE — Telephone Encounter (Signed)
Pt asking for refill of Sumatriptan. Last refill on 06/22/17. Former pt of Dr. Dayton MartesAron transferring to [facility] on 10/30/17.

## 2017-10-11 NOTE — Telephone Encounter (Signed)
TA-This pt is transferring to [facility] on 1.21.2019/she has not been seen since 10.16.2017/she is asking for a refill of Sumatriptan to last till visit/plz advise/thx dmf

## 2017-10-30 ENCOUNTER — Ambulatory Visit: Payer: BC Managed Care – PPO | Admitting: Family Medicine

## 2017-10-30 ENCOUNTER — Encounter: Payer: Self-pay | Admitting: Family Medicine

## 2017-10-30 VITALS — BP 128/82 | HR 78 | Temp 98.3°F | Wt 182.0 lb

## 2017-10-30 DIAGNOSIS — Z23 Encounter for immunization: Secondary | ICD-10-CM

## 2017-10-30 DIAGNOSIS — Z7184 Encounter for health counseling related to travel: Secondary | ICD-10-CM

## 2017-10-30 DIAGNOSIS — Z7189 Other specified counseling: Secondary | ICD-10-CM | POA: Diagnosis not present

## 2017-10-30 DIAGNOSIS — M25551 Pain in right hip: Secondary | ICD-10-CM | POA: Diagnosis not present

## 2017-10-30 DIAGNOSIS — G43709 Chronic migraine without aura, not intractable, without status migrainosus: Secondary | ICD-10-CM | POA: Diagnosis not present

## 2017-10-30 DIAGNOSIS — Z7689 Persons encountering health services in other specified circumstances: Secondary | ICD-10-CM | POA: Diagnosis not present

## 2017-10-30 MED ORDER — SUMATRIPTAN SUCCINATE 50 MG PO TABS: ORAL_TABLET | ORAL | 0 refills | Status: DC

## 2017-10-30 MED ORDER — TYPHOID VACCINE PO CPDR: 1.0000 | DELAYED_RELEASE_CAPSULE | ORAL | 0 refills | Status: DC

## 2017-10-30 NOTE — Progress Notes (Signed)
Subjective:    Patient ID: Amber Baker, female    DOB: January 03, 1966, 52 y.o.   MRN: [id number]  HPI This is a 52 yo female who presents today to establish care. Previously a patient of Dr. Dayton Martes.  She is a Interior and spatial designer.   Primary reason for visiting today if for refill of Sumatriptan prior to travel. Had increased incidence of migraines over the holidays and thinks related to increasing then decreasing caffeine. Has not had migraine in several weeks.    Last CPE- 11/17 Mammo- 09/05/16 Pap- 05/23/16- neg, neg HPV Colonoscopy- neve Tdap- unknown, does not have immunization records  Flu- declines Exercise- not on regular basis   Sees chiropractor for right hip pain. Pain worse with sitting. Wears supportive shoes.   Is going to [place] for a mission trip in 14 days- [place]. Has not been given any information regarding travel health. She has not had any immunizations in many years. Does not have immunization record.     Past Medical History:  Diagnosis Date  . Abdominal pain, generalized   . Abnormal weight gain   . Acute upper respiratory infections of unspecified site   . Essential hypertension, benign   . Gastroenteritis   . Migraine, unspecified, without mention of intractable migraine without mention of status migrainosus   . Routine general medical examination at a health care facility    No past surgical history on file. No family history on file. Social History   Tobacco Use  . Smoking status: Never Smoker  . Smokeless tobacco: Never Used  Substance Use Topics  . Alcohol use: No    Alcohol/week: 0.0 oz  . Drug use: No      Review of Systems Per HPI    Objective:   Physical Exam  Constitutional: She is oriented to person, place, and time. She appears well-developed and well-nourished. No distress.  HENT:  Head: Normocephalic and atraumatic.  Eyes: Conjunctivae are normal.  Cardiovascular: Normal rate.  Pulmonary/Chest: Effort normal.    Neurological: She is alert and oriented to person, place, and time.  Skin: Skin is warm and dry. She is not diaphoretic.  Psychiatric: She has a normal mood and affect. Her behavior is normal. Judgment and thought content normal.  Vitals reviewed.     BP 128/82   Pulse 78   Temp 98.3 F (36.8 C) (Oral)   Wt 182 lb (82.6 kg)   LMP 03/24/2014   SpO2 96%   BMI 31.24 kg/m  Wt Readings from Last 3 Encounters:  10/30/17 182 lb (82.6 kg)  07/25/16 171 lb 8 oz (77.8 kg)  07/04/16 168 lb 12.8 oz (76.6 kg)       Assessment & Plan:  1. Encounter to establish care - follow up in 6 months for CPE  2. Counseling about travel - reviewed CDC guidelines and recommendations with patient - Tdap vaccine greater than or equal to 7yo IM - typhoid (VIVOTIF) DR capsule; Take 1 capsule by mouth every other day. X 4 doses. Complete at least one week prior to travel.  Dispense: 4 capsule; Refill: 0 - Hepatitis A hepatitis B combined vaccine IM  3. Chronic migraine without aura without status migrainosus, not intractable - SUMAtriptan (IMITREX) 50 MG tablet; TAKE ONE TABLET AS NEEDED FOR MIGRAINE. MAY REPEAT IN TWO HOURS IF NEEDED *MAX OF 2 TABLETS IN 24 HOURS*  Dispense: 9 tablet; Refill: 0  4. Need for Tdap vaccination - Tdap vaccine greater than or equal to 7yo IM  5. Need for hepatitis A and B vaccination - Hepatitis A hepatitis B combined vaccine IM  6. Need for immunization against typhoid - typhoid (VIVOTIF) DR capsule; Take 1 capsule by mouth every other day. X 4 doses. Complete at least one week prior to travel.  Dispense: 4 capsule; Refill: 0  7. Right hip pain - not currently requiring medication, discussed OTC NSAID use and provided written and verbal instructions for stretching exercises.   Olean Reeeborah Sheelah Ritacco, FNP-BC  [facility], [facility]  10/30/2017 10:17 AM

## 2017-10-30 NOTE — Patient Instructions (Addendum)
Can take 2 Alleve with sumatriptan and again if you have to repeat in 2 hours.    Hip Exercises Ask your health care provider which exercises are safe for you. Do exercises exactly as told by your health care provider and adjust them as directed. It is normal to feel mild stretching, pulling, tightness, or discomfort as you do these exercises, but you should stop right away if you feel sudden pain or your pain gets worse.Do not begin these exercises until told by your health care provider. STRETCHING AND RANGE OF MOTION EXERCISES These exercises warm up your muscles and joints and improve the movement and flexibility of your hip. These exercises also help to relieve pain, numbness, and tingling. Exercise A: Hamstrings, Supine  1. Lie on your back. 2. Loop a belt or towel over the ball of your left / rightfoot. The ball of your foot is on the walking surface, right under your toes. 3. Straighten your left / rightknee and slowly pull on the belt to raise your leg. ? Do not let your left / right knee bend while you do this. ? Keep your other leg flat on the floor. ? Raise the left / right leg until you feel a gentle stretch behind your left / right knee or thigh. 4. Hold this position for __________ seconds. 5. Slowly return your leg to the starting position. Repeat __________ times. Complete this stretch __________ times a day. Exercise B: Hip Rotators  1. Lie on your back on a firm surface. 2. Hold your left / right knee with your left / right hand. Hold your ankle with your other hand. 3. Gently pull your left / right knee and rotate your lower leg toward your other shoulder. ? Pull until you feel a stretch in your buttocks. ? Keep your hips and shoulders firmly planted while you do this stretch. 4. Hold this position for __________ seconds. Repeat __________ times. Complete this stretch __________ times a day. Exercise C: V-Sit (Hamstrings and Adductors)  1. Sit on the floor with  your legs extended in a large "V" shape. Keep your knees straight during this exercise. 2. Start with your head and chest upright, then bend at your waist to reach for your left foot (position A). You should feel a stretch in your right inner thigh. 3. Hold this position for __________ seconds. Then slowly return to the upright position. 4. Bend at your waist to reach forward (position B). You should feel a stretch behind both of your thighs and knees. 5. Hold this position for __________ seconds. Then slowly return to the upright position. 6. Bend at your waist to reach for your right foot (position C). You should feel a stretch in your left inner thigh. 7. Hold this position for __________ seconds. Then slowly return to the upright position. Repeat __________ times. Complete this stretch __________ times a day. Exercise D: Lunge (Hip Flexors)  1. Place your left / right knee on the floor and bend your other knee so that is directly over your ankle. You should be half-kneeling. 2. Keep good posture with your head over your shoulders. 3. Tighten your buttocks to point your tailbone downward. This helps your back to keep from arching too much. 4. You should feel a gentle stretch in the front of your left / right thigh and hip. If you do not feel any resistance, slightly slide your other foot forward and then slowly lunge forward so your knee once again lines up over  your ankle. 5. Make sure your tailbone continues to point downward. 6. Hold this position for __________ seconds. Repeat __________ times. Complete this stretch __________ times a day. STRENGTHENING EXERCISES These exercises build strength and endurance in your hip. Endurance is the ability to use your muscles for a long time, even after they get tired. Exercise E: Bridge (Hip Extensors)  1. Lie on your back on a firm surface with your knees bent and your feet flat on the floor. 2. Tighten your buttocks muscles and lift your bottom  off the floor until the trunk of your body is level with your thighs. ? Do not arch your back. ? You should feel the muscles working in your buttocks and the back of your thighs. If you do not feel these muscles, slide your feet 1-2 inches (2.5-5 cm) farther away from your buttocks. 3. Hold this position for __________ seconds. 4. Slowly lower your hips to the starting position. 5. Let your muscles relax completely between repetitions. 6. If this exercise is too easy, try doing it with your arms crossed over your chest. Repeat __________ times. Complete this exercise __________ times a day. Exercise F: Straight Leg Raises - Hip Abductors  1. Lie on your side with your left / right leg in the top position. Lie so your head, shoulder, knee, and hip line up with each other. You may bend your bottom knee to help you balance. 2. Roll your hips slightly forward, so your hips are stacked directly over each other and your left / right knee is facing forward. 3. Leading with your heel, lift your top leg 4-6 inches (10-15 cm). You should feel the muscles in your outer hip lifting. ? Do not let your foot drift forward. ? Do not let your knee roll toward the ceiling. 4. Hold this position for __________ seconds. 5. Slowly return to the starting position. 6. Let your muscles relax completely between repetitions. Repeat __________ times. Complete this exercise __________ times a day. Exercise G: Straight Leg Raises - Hip Adductors  1. Lie on your side with your left / right leg in the bottom position. Lie so your head, shoulder, knee, and hip line up. You may place your upper foot in front to help you balance. 2. Roll your hips slightly forward, so your hips are stacked directly over each other and your left / right knee is facing forward. 3. Tense the muscles in your inner thigh and lift your bottom leg 4-6 inches (10-15 cm). 4. Hold this position for __________ seconds. 5. Slowly return to the starting  position. 6. Let your muscles relax completely between repetitions. Repeat __________ times. Complete this exercise __________ times a day. Exercise H: Straight Leg Raises - Quadriceps  1. Lie on your back with your left / right leg extended and your other knee bent. 2. Tense the muscles in the front of your left / right thigh. When you do this, you should see your kneecap slide up or see increased dimpling just above your knee. 3. Tighten these muscles even more and raise your leg 4-6 inches (10-15 cm) off the floor. 4. Hold this position for __________ seconds. 5. Keep these muscles tense as you lower your leg. 6. Relax the muscles slowly and completely between repetitions. Repeat __________ times. Complete this exercise __________ times a day. Exercise I: Hip Abductors, Standing 1. Tie one end of a rubber exercise band or tubing to a secure surface, such as a table or pole. 2. Loop  the other end of the band or tubing around your left / right ankle. 3. Keeping your ankle with the band or tubing directly opposite of the secured end, step away until there is tension in the tubing or band. Hold onto a chair as needed for balance. 4. Lift your left / right leg out to your side. While you do this: ? Keep your back upright. ? Keep your shoulders over your hips. ? Keep your toes pointing forward. ? Make sure to use your hip muscles to lift your leg. Do not "throw" your leg or tip your body to lift your leg. 5. Hold this position for __________ seconds. 6. Slowly return to the starting position. Repeat __________ times. Complete this exercise __________ times a day. Exercise J: Squats (Quadriceps) 1. Stand in a door frame so your feet and knees are in line with the frame. You may place your hands on the frame for balance. 2. Slowly bend your knees and lower your hips like you are going to sit in a chair. ? Keep your lower legs in a straight-up-and-down position. ? Do not let your hips go lower  than your knees. ? Do not bend your knees lower than told by your health care provider. ? If your hip pain increases, do not bend as low. 3. Hold this position for ___________ seconds. 4. Slowly push with your legs to return to standing. Do not use your hands to pull yourself to standing. Repeat __________ times. Complete this exercise __________ times a day. This information is not intended to replace advice given to you by your health care provider. Make sure you discuss any questions you have with your health care provider. Document Released: 10/14/2005 Document Revised: 06/20/2016 Document Reviewed: 09/21/2015 [employer] Interactive Patient Education  [employer].

## 2017-12-24 ENCOUNTER — Other Ambulatory Visit: Payer: Self-pay | Admitting: Family Medicine

## 2017-12-24 DIAGNOSIS — G43709 Chronic migraine without aura, not intractable, without status migrainosus: Secondary | ICD-10-CM

## 2017-12-28 ENCOUNTER — Other Ambulatory Visit: Payer: Self-pay | Admitting: Family Medicine

## 2017-12-28 DIAGNOSIS — G43709 Chronic migraine without aura, not intractable, without status migrainosus: Secondary | ICD-10-CM

## 2017-12-28 MED ORDER — SUMATRIPTAN SUCCINATE 50 MG PO TABS: ORAL_TABLET | ORAL | 0 refills | Status: DC

## 2017-12-28 NOTE — Telephone Encounter (Signed)
Pt recent transfer from Dr Dayton MartesAron; 10/2017. Last Rx 10/11/2017

## 2018-02-14 ENCOUNTER — Other Ambulatory Visit: Payer: Self-pay | Admitting: Family Medicine

## 2018-02-14 DIAGNOSIS — G43709 Chronic migraine without aura, not intractable, without status migrainosus: Secondary | ICD-10-CM

## 2018-02-15 MED ORDER — SUMATRIPTAN SUCCINATE 50 MG PO TABS: ORAL_TABLET | ORAL | 0 refills | Status: DC

## 2018-03-20 ENCOUNTER — Other Ambulatory Visit: Payer: Self-pay | Admitting: Family Medicine

## 2018-03-20 DIAGNOSIS — G43709 Chronic migraine without aura, not intractable, without status migrainosus: Secondary | ICD-10-CM

## 2018-03-21 ENCOUNTER — Other Ambulatory Visit: Payer: Self-pay | Admitting: Family Medicine

## 2018-03-21 DIAGNOSIS — G43709 Chronic migraine without aura, not intractable, without status migrainosus: Secondary | ICD-10-CM

## 2018-05-16 ENCOUNTER — Other Ambulatory Visit: Payer: Self-pay | Admitting: Family Medicine

## 2018-05-16 DIAGNOSIS — G43709 Chronic migraine without aura, not intractable, without status migrainosus: Secondary | ICD-10-CM

## 2018-05-17 ENCOUNTER — Other Ambulatory Visit: Payer: Self-pay | Admitting: Family Medicine

## 2018-05-17 DIAGNOSIS — G43709 Chronic migraine without aura, not intractable, without status migrainosus: Secondary | ICD-10-CM

## 2018-05-17 NOTE — Telephone Encounter (Signed)
Last filled 03/21/18... Please advise

## 2018-05-18 MED ORDER — SUMATRIPTAN SUCCINATE 50 MG PO TABS: ORAL_TABLET | ORAL | 0 refills | Status: DC

## 2018-06-18 ENCOUNTER — Ambulatory Visit (INDEPENDENT_AMBULATORY_CARE_PROVIDER_SITE_OTHER): Payer: BC Managed Care – PPO | Admitting: Family Medicine

## 2018-06-18 ENCOUNTER — Other Ambulatory Visit: Payer: Self-pay | Admitting: Family Medicine

## 2018-06-18 ENCOUNTER — Encounter: Payer: Self-pay | Admitting: Family Medicine

## 2018-06-18 VITALS — BP 168/104 | HR 78 | Ht 64.0 in | Wt 188.4 lb

## 2018-06-18 DIAGNOSIS — G43709 Chronic migraine without aura, not intractable, without status migrainosus: Secondary | ICD-10-CM

## 2018-06-18 DIAGNOSIS — L658 Other specified nonscarring hair loss: Secondary | ICD-10-CM

## 2018-06-18 DIAGNOSIS — Z1211 Encounter for screening for malignant neoplasm of colon: Secondary | ICD-10-CM

## 2018-06-18 DIAGNOSIS — I10 Essential (primary) hypertension: Secondary | ICD-10-CM | POA: Diagnosis not present

## 2018-06-18 DIAGNOSIS — Z0000 Encounter for general adult medical examination without abnormal findings: Secondary | ICD-10-CM | POA: Diagnosis not present

## 2018-06-18 LAB — CBC WITH DIFFERENTIAL/PLATELET
BASOS ABS: 0 10*3/uL (ref 0.0–0.1)
Basophils Relative: 0.7 % (ref 0.0–3.0)
Eosinophils Absolute: 0.1 10*3/uL (ref 0.0–0.7)
Eosinophils Relative: 1.8 % (ref 0.0–5.0)
HEMATOCRIT: 43.1 % (ref 36.0–46.0)
Hemoglobin: 14.7 g/dL (ref 12.0–15.0)
LYMPHS PCT: 30.5 % (ref 12.0–46.0)
Lymphs Abs: 1.7 10*3/uL (ref 0.7–4.0)
MCHC: 34.1 g/dL (ref 30.0–36.0)
MCV: 87 fl (ref 78.0–100.0)
MONO ABS: 0.6 10*3/uL (ref 0.1–1.0)
Monocytes Relative: 10.1 % (ref 3.0–12.0)
NEUTROS ABS: 3.2 10*3/uL (ref 1.4–7.7)
NEUTROS PCT: 56.9 % (ref 43.0–77.0)
PLATELETS: 354 10*3/uL (ref 150.0–400.0)
RBC: 4.95 Mil/uL (ref 3.87–5.11)
RDW: 13.3 % (ref 11.5–15.5)
WBC: 5.6 10*3/uL (ref 4.0–10.5)

## 2018-06-18 LAB — COMPREHENSIVE METABOLIC PANEL
ALT: 21 U/L (ref 0–35)
AST: 20 U/L (ref 0–37)
Albumin: 4.7 g/dL (ref 3.5–5.2)
Alkaline Phosphatase: 83 U/L (ref 39–117)
BUN: 12 mg/dL (ref 6–23)
CALCIUM: 9.7 mg/dL (ref 8.4–10.5)
CO2: 27 meq/L (ref 19–32)
CREATININE: 0.8 mg/dL (ref 0.40–1.20)
Chloride: 105 mEq/L (ref 96–112)
GFR: 80.16 mL/min (ref >60.00)
GLUCOSE: 95 mg/dL (ref 70–99)
Potassium: 4.1 mEq/L (ref 3.5–5.1)
SODIUM: 139 meq/L (ref 135–145)
Total Bilirubin: 0.4 mg/dL (ref 0.2–1.2)
Total Protein: 7.6 g/dL (ref 6.0–8.3)

## 2018-06-18 LAB — LIPID PANEL
CHOL/HDL RATIO: 3
Cholesterol: 164 mg/dL (ref 0–200)
HDL: 50.5 mg/dL (ref >39.00)
LDL CALC: 96 mg/dL (ref 0–99)
NONHDL: 113.87
Triglycerides: 91 mg/dL (ref 0.0–149.0)
VLDL: 18.2 mg/dL (ref 0.0–40.0)

## 2018-06-18 LAB — HEMOGLOBIN A1C: Hgb A1c MFr Bld: 5.9 % (ref 4.6–6.5)

## 2018-06-18 LAB — TSH: TSH: 0.58 u[IU]/mL (ref 0.35–4.50)

## 2018-06-18 MED ORDER — RAMIPRIL 2.5 MG PO CAPS: 2.5000 mg | ORAL_CAPSULE | Freq: Every day | ORAL | 0 refills | Status: DC

## 2018-06-18 NOTE — Patient Instructions (Addendum)
Please call and schedule an appointment for screening mammogram. A referral is not needed.  [place]  [id number]  Please check your blood pressure no more than 1-2 times a week, keep a log  Follow up in 1 month for blood pressure check and lab work     Health Maintenance, Female Adopting a healthy lifestyle and getting preventive care can go a long way to promote health and wellness. Talk with your health care provider about what schedule of regular examinations is right for you. This is a good chance for you to check in with your provider about disease prevention and staying healthy. In between checkups, there are plenty of things you can do on your own. Experts have done a lot of research about which lifestyle changes and preventive measures are most likely to keep you healthy. Ask your health care provider for more information. Weight and diet Eat a healthy diet  Be sure to include plenty of vegetables, fruits, low-fat dairy products, and lean protein.  Do not eat a lot of foods high in solid fats, added sugars, or salt.  Get regular exercise. This is one of the most important things you can do for your health. ? Most adults should exercise for at least 150 minutes each week. The exercise should increase your heart rate and make you sweat (moderate-intensity exercise). ? Most adults should also do strengthening exercises at least twice a week. This is in addition to the moderate-intensity exercise.  Maintain a healthy weight  Body mass index (BMI) is a measurement that can be used to identify possible weight problems. It estimates body fat based on height and weight. Your health care provider can help determine your BMI and help you achieve or maintain a healthy weight.  For females 56 years of age and older: ? A BMI below 18.5 is considered underweight. ? A BMI of 18.5 to 24.9 is  normal. ? A BMI of 25 to 29.9 is considered overweight. ? A BMI of 30 and above is considered obese.  Watch levels of cholesterol and blood lipids  You should start having your blood tested for lipids and cholesterol at 51 years of age, then have this test every 5 years.  You may need to have your cholesterol levels checked more often if: ? Your lipid or cholesterol levels are high. ? You are older than 51 years of age. ? You are at high risk for heart disease.  Cancer screening Lung Cancer  Lung cancer screening is recommended for adults 46-104 years old who are at high risk for lung cancer because of a history of smoking.  A yearly low-dose CT scan of the lungs is recommended for people who: ? Currently smoke. ? Have quit within the past 15 years. ? Have at least a 30-pack-year history of smoking. A pack year is smoking an average of one pack of cigarettes a day for 1 year.  Yearly screening should continue until it has been 15 years since you quit.  Yearly screening should stop if you develop a health problem that would prevent you from having lung cancer treatment.  Breast Cancer  Practice breast self-awareness. This means understanding how your breasts normally appear and feel.  It also means doing regular breast self-exams. Let your health care provider know about any changes, no matter how small.  If you are in your 20s or 30s, you should have a clinical breast exam (CBE)  by a health care provider every 1-3 years as part of a regular health exam.  If you are 40 or older, have a CBE every year. Also consider having a breast X-ray (mammogram) every year.  If you have a family history of breast cancer, talk to your health care provider about genetic screening.  If you are at high risk for breast cancer, talk to your health care provider about having an MRI and a mammogram every year.  Breast cancer gene (BRCA) assessment is recommended for women who have family members  with BRCA-related cancers. BRCA-related cancers include: ? Breast. ? Ovarian. ? Tubal. ? Peritoneal cancers.  Results of the assessment will determine the need for genetic counseling and BRCA1 and BRCA2 testing.  Cervical Cancer Your health care provider may recommend that you be screened regularly for cancer of the pelvic organs (ovaries, uterus, and vagina). This screening involves a pelvic examination, including checking for microscopic changes to the surface of your cervix (Pap test). You may be encouraged to have this screening done every 3 years, beginning at age 21.  For women ages 30-65, health care providers may recommend pelvic exams and Pap testing every 3 years, or they may recommend the Pap and pelvic exam, combined with testing for human papilloma virus (HPV), every 5 years. Some types of HPV increase your risk of cervical cancer. Testing for HPV may also be done on women of any age with unclear Pap test results.  Other health care providers may not recommend any screening for nonpregnant women who are considered low risk for pelvic cancer and who do not have symptoms. Ask your health care provider if a screening pelvic exam is right for you.  If you have had past treatment for cervical cancer or a condition that could lead to cancer, you need Pap tests and screening for cancer for at least 20 years after your treatment. If Pap tests have been discontinued, your risk factors (such as having a new sexual partner) need to be reassessed to determine if screening should resume. Some women have medical problems that increase the chance of getting cervical cancer. In these cases, your health care provider may recommend more frequent screening and Pap tests.  Colorectal Cancer  This type of cancer can be detected and often prevented.  Routine colorectal cancer screening usually begins at 50 years of age and continues through 51 years of age.  Your health care provider may recommend  screening at an earlier age if you have risk factors for colon cancer.  Your health care provider may also recommend using home test kits to check for hidden blood in the stool.  A small camera at the end of a tube can be used to examine your colon directly (sigmoidoscopy or colonoscopy). This is done to check for the earliest forms of colorectal cancer.  Routine screening usually begins at age 50.  Direct examination of the colon should be repeated every 5-10 years through 51 years of age. However, you may need to be screened more often if early forms of precancerous polyps or small growths are found.  Skin Cancer  Check your skin from head to toe regularly.  Tell your health care provider about any new moles or changes in moles, especially if there is a change in a mole's shape or color.  Also tell your health care provider if you have a mole that is larger than the size of a pencil eraser.  Always use sunscreen. Apply sunscreen liberally and   repeatedly throughout the day.  Protect yourself by wearing long sleeves, pants, a wide-brimmed hat, and sunglasses whenever you are outside.  Heart disease, diabetes, and high blood pressure  High blood pressure causes heart disease and increases the risk of stroke. High blood pressure is more likely to develop in: ? People who have blood pressure in the high end of the normal range (130-139/85-89 mm Hg). ? People who are overweight or obese. ? People who are African American.  If you are 18-39 years of age, have your blood pressure checked every 3-5 years. If you are 40 years of age or older, have your blood pressure checked every year. You should have your blood pressure measured twice-once when you are at a hospital or clinic, and once when you are not at a hospital or clinic. Record the average of the two measurements. To check your blood pressure when you are not at a hospital or clinic, you can use: ? An automated blood pressure machine at  a pharmacy. ? A home blood pressure monitor.  If you are between 55 years and 79 years old, ask your health care provider if you should take aspirin to prevent strokes.  Have regular diabetes screenings. This involves taking a blood sample to check your fasting blood sugar level. ? If you are at a normal weight and have a low risk for diabetes, have this test once every three years after 51 years of age. ? If you are overweight and have a high risk for diabetes, consider being tested at a younger age or more often. Preventing infection Hepatitis B  If you have a higher risk for hepatitis B, you should be screened for this virus. You are considered at high risk for hepatitis B if: ? You were born in a country where hepatitis B is common. Ask your health care provider which countries are considered high risk. ? Your parents were born in a high-risk country, and you have not been immunized against hepatitis B (hepatitis B vaccine). ? You have HIV or AIDS. ? You use needles to inject street drugs. ? You live with someone who has hepatitis B. ? You have had sex with someone who has hepatitis B. ? You get hemodialysis treatment. ? You take certain medicines for conditions, including cancer, organ transplantation, and autoimmune conditions.  Hepatitis C  Blood testing is recommended for: ? Everyone born from 1945 through 1965. ? Anyone with known risk factors for hepatitis C.  Sexually transmitted infections (STIs)  You should be screened for sexually transmitted infections (STIs) including gonorrhea and chlamydia if: ? You are sexually active and are younger than 51 years of age. ? You are older than 51 years of age and your health care provider tells you that you are at risk for this type of infection. ? Your sexual activity has changed since you were last screened and you are at an increased risk for chlamydia or gonorrhea. Ask your health care provider if you are at risk.  If you do  not have HIV, but are at risk, it may be recommended that you take a prescription medicine daily to prevent HIV infection. This is called pre-exposure prophylaxis (PrEP). You are considered at risk if: ? You are sexually active and do not regularly use condoms or know the HIV status of your partner(s). ? You take drugs by injection. ? You are sexually active with a partner who has HIV.  Talk with your health care provider about whether you   are at high risk of being infected with HIV. If you choose to begin PrEP, you should first be tested for HIV. You should then be tested every 3 months for as long as you are taking PrEP. Pregnancy  If you are premenopausal and you may become pregnant, ask your health care provider about preconception counseling.  If you may become pregnant, take 400 to 800 micrograms (mcg) of folic acid every day.  If you want to prevent pregnancy, talk to your health care provider about birth control (contraception). Osteoporosis and menopause  Osteoporosis is a disease in which the bones lose minerals and strength with aging. This can result in serious bone fractures. Your risk for osteoporosis can be identified using a bone density scan.  If you are 62 years of age or older, or if you are at risk for osteoporosis and fractures, ask your health care provider if you should be screened.  Ask your health care provider whether you should take a calcium or vitamin D supplement to lower your risk for osteoporosis.  Menopause may have certain physical symptoms and risks.  Hormone replacement therapy may reduce some of these symptoms and risks. Talk to your health care provider about whether hormone replacement therapy is right for you. Follow these instructions at home:  Schedule regular health, dental, and eye exams.  Stay current with your immunizations.  Do not use any tobacco products including cigarettes, chewing tobacco, or electronic cigarettes.  If you are  pregnant, do not drink alcohol.  If you are breastfeeding, limit how much and how often you drink alcohol.  Limit alcohol intake to no more than 1 drink per day for nonpregnant women. One drink equals 12 ounces of beer, 5 ounces of wine, or 1 ounces of hard liquor.  Do not use street drugs.  Do not share needles.  Ask your health care provider for help if you need support or information about quitting drugs.  Tell your health care provider if you often feel depressed.  Tell your health care provider if you have ever been abused or do not feel safe at home. This information is not intended to replace advice given to you by your health care provider. Make sure you discuss any questions you have with your health care provider. Document Released: 04/11/2011 Document Revised: 03/03/2016 Document Reviewed: 06/30/2015 Elsevier Interactive Patient Education  2018 [place].   Hip Exercises Ask your health care provider which exercises are safe for you. Do exercises exactly as told by your health care provider and adjust them as directed. It is normal to feel mild stretching, pulling, tightness, or discomfort as you do these exercises, but you should stop right away if you feel sudden pain or your pain gets worse.Do not begin these exercises until told by your health care provider. STRETCHING AND RANGE OF MOTION EXERCISES These exercises warm up your muscles and joints and improve the movement and flexibility of your hip. These exercises also help to relieve pain, numbness, and tingling. Exercise A: Hamstrings, Supine  1. Lie on your back. 2. Loop a belt or towel over the ball of your left / rightfoot. The ball of your foot is on the walking surface, right under your toes. 3. Straighten your left / rightknee and slowly pull on the belt to raise your leg. ? Do not let your left / right knee bend while you do this. ? Keep your other leg flat on the floor. ? Raise the left / right leg  until you feel a gentle stretch behind your left / right knee or thigh. 4. Hold this position for __________ seconds. 5. Slowly return your leg to the starting position. Repeat __________ times. Complete this stretch __________ times a day. Exercise B: Hip Rotators  1. Lie on your back on a firm surface. 2. Hold your left / right knee with your left / right hand. Hold your ankle with your other hand. 3. Gently pull your left / right knee and rotate your lower leg toward your other shoulder. ? Pull until you feel a stretch in your buttocks. ? Keep your hips and shoulders firmly planted while you do this stretch. 4. Hold this position for __________ seconds. Repeat __________ times. Complete this stretch __________ times a day. Exercise C: V-Sit (Hamstrings and Adductors)  1. Sit on the floor with your legs extended in a large "V" shape. Keep your knees straight during this exercise. 2. Start with your head and chest upright, then bend at your waist to reach for your left foot (position A). You should feel a stretch in your right inner thigh. 3. Hold this position for __________ seconds. Then slowly return to the upright position. 4. Bend at your waist to reach forward (position B). You should feel a stretch behind both of your thighs and knees. 5. Hold this position for __________ seconds. Then slowly return to the upright position. 6. Bend at your waist to reach for your right foot (position C). You should feel a stretch in your left inner thigh. 7. Hold this position for __________ seconds. Then slowly return to the upright position. Repeat __________ times. Complete this stretch __________ times a day. Exercise D: Lunge (Hip Flexors)  1. Place your left / right knee on the floor and bend your other knee so that is directly over your ankle. You should be half-kneeling. 2. Keep good posture with your head over your shoulders. 3. Tighten your buttocks to point your tailbone downward. This  helps your back to keep from arching too much. 4. You should feel a gentle stretch in the front of your left / right thigh and hip. If you do not feel any resistance, slightly slide your other foot forward and then slowly lunge forward so your knee once again lines up over your ankle. 5. Make sure your tailbone continues to point downward. 6. Hold this position for __________ seconds. Repeat __________ times. Complete this stretch __________ times a day. STRENGTHENING EXERCISES These exercises build strength and endurance in your hip. Endurance is the ability to use your muscles for a long time, even after they get tired. Exercise E: Bridge (Hip Extensors)  1. Lie on your back on a firm surface with your knees bent and your feet flat on the floor. 2. Tighten your buttocks muscles and lift your bottom off the floor until the trunk of your body is level with your thighs. ? Do not arch your back. ? You should feel the muscles working in your buttocks and the back of your thighs. If you do not feel these muscles, slide your feet 1-2 inches (2.5-5 cm) farther away from your buttocks. 3. Hold this position for __________ seconds. 4. Slowly lower your hips to the starting position. 5. Let your muscles relax completely between repetitions. 6. If this exercise is too easy, try doing it with your arms crossed over your chest. Repeat __________ times. Complete this exercise __________ times a day. Exercise F: Straight Leg Raises - Hip Abductors  1.  Lie on your side with your left / right leg in the top position. Lie so your head, shoulder, knee, and hip line up with each other. You may bend your bottom knee to help you balance. 2. Roll your hips slightly forward, so your hips are stacked directly over each other and your left / right knee is facing forward. 3. Leading with your heel, lift your top leg 4-6 inches (10-15 cm). You should feel the muscles in your outer hip lifting. ? Do not let your foot  drift forward. ? Do not let your knee roll toward the ceiling. 4. Hold this position for __________ seconds. 5. Slowly return to the starting position. 6. Let your muscles relax completely between repetitions. Repeat __________ times. Complete this exercise __________ times a day. Exercise G: Straight Leg Raises - Hip Adductors  1. Lie on your side with your left / right leg in the bottom position. Lie so your head, shoulder, knee, and hip line up. You may place your upper foot in front to help you balance. 2. Roll your hips slightly forward, so your hips are stacked directly over each other and your left / right knee is facing forward. 3. Tense the muscles in your inner thigh and lift your bottom leg 4-6 inches (10-15 cm). 4. Hold this position for __________ seconds. 5. Slowly return to the starting position. 6. Let your muscles relax completely between repetitions. Repeat __________ times. Complete this exercise __________ times a day. Exercise H: Straight Leg Raises - Quadriceps  1. Lie on your back with your left / right leg extended and your other knee bent. 2. Tense the muscles in the front of your left / right thigh. When you do this, you should see your kneecap slide up or see increased dimpling just above your knee. 3. Tighten these muscles even more and raise your leg 4-6 inches (10-15 cm) off the floor. 4. Hold this position for __________ seconds. 5. Keep these muscles tense as you lower your leg. 6. Relax the muscles slowly and completely between repetitions. Repeat __________ times. Complete this exercise __________ times a day. Exercise I: Hip Abductors, Standing 1. Tie one end of a rubber exercise band or tubing to a secure surface, such as a table or pole. 2. Loop the other end of the band or tubing around your left / right ankle. 3. Keeping your ankle with the band or tubing directly opposite of the secured end, step away until there is tension in the tubing or band.  Hold onto a chair as needed for balance. 4. Lift your left / right leg out to your side. While you do this: ? Keep your back upright. ? Keep your shoulders over your hips. ? Keep your toes pointing forward. ? Make sure to use your hip muscles to lift your leg. Do not "throw" your leg or tip your body to lift your leg. 5. Hold this position for __________ seconds. 6. Slowly return to the starting position. Repeat __________ times. Complete this exercise __________ times a day. Exercise J: Squats (Quadriceps) 1. Stand in a door frame so your feet and knees are in line with the frame. You may place your hands on the frame for balance. 2. Slowly bend your knees and lower your hips like you are going to sit in a chair. ? Keep your lower legs in a straight-up-and-down position. ? Do not let your hips go lower than your knees. ? Do not bend your knees lower than  told by your health care provider. ? If your hip pain increases, do not bend as low. 3. Hold this position for ___________ seconds. 4. Slowly push with your legs to return to standing. Do not use your hands to pull yourself to standing. Repeat __________ times. Complete this exercise __________ times a day. This information is not intended to replace advice given to you by your health care provider. Make sure you discuss any questions you have with your health care provider. Document Released: 10/14/2005 Document Revised: 06/20/2016 Document Reviewed: 09/21/2015 Else[employer] Interactive Patient Education  [employer].

## 2018-06-18 NOTE — Progress Notes (Signed)
Subjective:    Patient ID: Amber Baker, female    DOB: 03-01-1966, 52 y.o.   MRN: [id number]  HPI This is a 52 yo female who presents today for CPE.   Last CPE- 11/17 Mammo-  09/05/18 Pap- 05/23/16- neg, neg HPV Colonoscopy- never Tdap- unsure Flu- declines Dental- regular Exercise- no  Hypertension- was on ramipril previously, has gained weight and blood pressure is up. Eating out more. Has been checking at home with automatic cuff.  Running similar to reading in office today.   Biotin- was taking for hair loss. Stopped due to increased migraines.   Weight gain- was able to lose on strict dieting, but unable to sustain. Is interested in trying intermittent fasting. Drinks 2 bottles regular soda daily. Eats out frequently.   Right hip pain- improved with chiropractor and massage  Past Medical History:  Diagnosis Date  . Abdominal pain, generalized   . Abnormal weight gain   . Acute upper respiratory infections of unspecified site   . Essential hypertension, benign   . Gastroenteritis   . Migraine, unspecified, without mention of intractable migraine without mention of status migrainosus   . Routine general medical examination at a health care facility    No past surgical history on file. No family history on file. Social History   Tobacco Use  . Smoking status: Never Smoker  . Smokeless tobacco: Never Used  Substance Use Topics  . Alcohol use: No    Alcohol/week: 0.0 standard drinks  . Drug use: No     Review of Systems  Constitutional: Negative.   HENT: Positive for congestion (recent nasal congestion).   Eyes: Negative.   Respiratory: Positive for cough (dry that started today).   Cardiovascular: Negative.   Gastrointestinal: Negative.   Endocrine: Negative.   Musculoskeletal: Negative.   Skin:       Hair loss around forehead, growing back in.   Allergic/Immunologic: Positive for environmental allergies (seasonal).  Neurological: Negative.     Hematological: Negative.   Psychiatric/Behavioral: Negative.        Objective:   Physical Exam Physical Exam  Constitutional: She is oriented to person, place, and time. She appears well-developed and well-nourished. No distress.  HENT:  Head: Normocephalic and atraumatic.  Right Ear: External ear normal.  Left Ear: External ear normal.  Nose: Nose normal.  Mouth/Throat: Oropharynx is clear and moist. No oropharyngeal exudate.  Eyes: Conjunctivae are normal. Pupils are equal, round, and reactive to light.  Neck: Normal range of motion. Neck supple. No JVD present. No thyromegaly present.  Cardiovascular: Normal rate, regular rhythm, normal heart sounds and intact distal pulses.   Pulmonary/Chest: Effort normal and breath sounds normal. Right breast exhibits no inverted nipple, no mass, no nipple discharge, no skin change and no tenderness. Left breast exhibits no inverted nipple, no mass, no nipple discharge, no skin change and no tenderness. Breasts are symmetrical.  Abdominal: Soft. Bowel sounds are normal. She exhibits no distension and no mass. There is no tenderness. There is no rebound and no guarding.  Lymphadenopathy:    She has no cervical adenopathy.  Neurological: She is alert and oriented to person, place, and time.   Skin: Skin is warm and dry. She is not diaphoretic.  Psychiatric: She has a normal mood and affect. Her behavior is normal. Judgment and thought content normal.  Vitals reviewed.  BP (!) 168/104 (BP Location: Right Arm, Patient Position: Sitting, Cuff Size: Normal)   Pulse 78   Ht 5'  4" (1.626 m)   Wt 188 lb 6.4 oz (85.5 kg)   LMP 03/24/2014   SpO2 97%   BMI 32.34 kg/m  Wt Readings from Last 3 Encounters:  06/18/18 188 lb 6.4 oz (85.5 kg)  10/30/17 182 lb (82.6 kg)  07/25/16 171 lb 8 oz (77.8 kg)   BP Readings from Last 3 Encounters:  06/18/18 (!) 168/104  10/30/17 128/82  07/25/16 130/80       Assessment & Plan:  1. Annual physical exam -  Discussed and encouraged healthy lifestyle choices- adequate sleep, regular exercise, stress management and healthy food choices.    2. Essential hypertension - keep log of home readings, RTC in 1 month for recheck and BMET - TSH - CBC with Differential - Comprehensive metabolic panel - Lipid Panel - Hemoglobin A1c - ramipril (ALTACE) 2.5 MG capsule; Take 1 capsule (2.5 mg total) by mouth daily.  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 Colon cancer screening - Ambulatory referral to Gastroenterology   Olean Ree, FNP-BC  [facility], [facility]  06/18/2018 12:05 PM

## 2018-06-19 ENCOUNTER — Other Ambulatory Visit: Payer: Self-pay | Admitting: Emergency Medicine

## 2018-06-20 NOTE — Telephone Encounter (Signed)
Okay to refill? 

## 2018-07-10 ENCOUNTER — Encounter: Payer: Self-pay | Admitting: Internal Medicine

## 2018-07-23 ENCOUNTER — Ambulatory Visit: Payer: BC Managed Care – PPO | Admitting: Family Medicine

## 2018-07-23 ENCOUNTER — Encounter: Payer: Self-pay | Admitting: Family Medicine

## 2018-07-23 VITALS — BP 130/80 | HR 73 | Temp 98.0°F | Ht 64.0 in | Wt 187.8 lb

## 2018-07-23 DIAGNOSIS — I10 Essential (primary) hypertension: Secondary | ICD-10-CM

## 2018-07-23 NOTE — Progress Notes (Signed)
   Subjective:    Patient ID: Amber Baker, female    DOB: 05-15-1966, 52 y.o.   MRN: [id number]  HPI This is a 52 yo female who presents today for follow up of elevated BP. Tolerating ramipril 2.5 mg without side effects. Brings home readings, most 130-140/80-90, occasionally higher. Has been feeling well.   Past Medical History:  Diagnosis Date  . Abdominal pain, generalized   . Abnormal weight gain   . Acute upper respiratory infections of unspecified site   . Essential hypertension, benign   . Gastroenteritis   . Migraine, unspecified, without mention of intractable migraine without mention of status migrainosus   . Routine general medical examination at a health care facility    No past surgical history on file. No family history on file. Social History   Tobacco Use  . Smoking status: Never Smoker  . Smokeless tobacco: Never Used  Substance Use Topics  . Alcohol use: No    Alcohol/week: 0.0 standard drinks  . Drug use: No      Review of Systems Per HPI    Objective:   Physical Exam  Constitutional: She is oriented to person, place, and time. She appears well-developed and well-nourished. No distress.  HENT:  Head: Normocephalic and atraumatic.  Cardiovascular: Normal rate.  Pulmonary/Chest: Effort normal.  Neurological: She is alert and oriented to person, place, and time.  Skin: Skin is warm and dry. She is not diaphoretic.  Psychiatric: She has a normal mood and affect. Her behavior is normal. Judgment and thought content normal.  Vitals reviewed.     BP 130/80 (BP Location: Left Arm, Patient Position: Sitting, Cuff Size: Large)   Pulse 73   Temp 98 F (36.7 C) (Oral)   Ht 5\' 4"  (1.626 m)   Wt 187 lb 12.8 oz (85.2 kg)   LMP 03/24/2014   SpO2 97%   BMI 32.24 kg/m  Wt Readings from Last 3 Encounters:  07/23/18 187 lb 12.8 oz (85.2 kg)  06/18/18 188 lb 6.4 oz (85.5 kg)  10/30/17 182 lb (82.6 kg)   BP Readings from Last 3 Encounters:  07/23/18  130/80  06/18/18 (!) 168/104  10/30/17 128/82       Assessment & Plan:  1. Essential hypertension - well controlled today, continue current med, monitor occasionally at home - follow up in 6 months, recheck labs for prediabetes - encouraged healthy food choices, decreased soda intake   Olean Ree, FNP-BC  [facility], [facility]  07/23/2018 9:48 AM

## 2018-07-23 NOTE — Patient Instructions (Signed)
Blood pressure looks good today, check twice a month or so, let me know if you notice it consistently higher than 140/90  Follow up in 6 months for blood work

## 2018-08-20 ENCOUNTER — Encounter: Payer: Self-pay | Admitting: Internal Medicine

## 2018-08-20 ENCOUNTER — Ambulatory Visit: Payer: BC Managed Care – PPO

## 2018-08-20 VITALS — Ht 64.0 in | Wt 190.0 lb

## 2018-08-20 DIAGNOSIS — Z1211 Encounter for screening for malignant neoplasm of colon: Secondary | ICD-10-CM

## 2018-08-20 NOTE — Progress Notes (Signed)
Denies allergies to eggs or soy products. Denies complication of anesthesia or sedation. Denies use of weight loss medication. Denies use of O2.   Emmi instructions declined.  

## 2018-08-27 ENCOUNTER — Other Ambulatory Visit: Payer: Self-pay | Admitting: Family Medicine

## 2018-08-27 DIAGNOSIS — G43709 Chronic migraine without aura, not intractable, without status migrainosus: Secondary | ICD-10-CM

## 2018-08-28 NOTE — Telephone Encounter (Signed)
Last filled 06/20/18 #9 refills 0 Last OV 07/23/18

## 2018-08-29 NOTE — Telephone Encounter (Signed)
Called and spoke with patient, she states that she has used all 9 pills. She has been getting them more frequently lately. On average she is using 2 to 3 weekly. But she may go weeks without one.

## 2018-08-29 NOTE — Telephone Encounter (Signed)
Called and left message for pt to return call to office.  

## 2018-08-29 NOTE — Telephone Encounter (Signed)
Please find out if she has used all 9 tablets already?

## 2018-09-03 ENCOUNTER — Encounter: Payer: Self-pay | Admitting: Internal Medicine

## 2018-09-03 ENCOUNTER — Ambulatory Visit (AMBULATORY_SURGERY_CENTER): Payer: BC Managed Care – PPO | Admitting: Internal Medicine

## 2018-09-03 VITALS — BP 148/88 | HR 68 | Temp 97.8°F | Resp 12 | Ht 64.0 in | Wt 187.0 lb

## 2018-09-03 DIAGNOSIS — Z1211 Encounter for screening for malignant neoplasm of colon: Secondary | ICD-10-CM

## 2018-09-03 MED ORDER — SODIUM CHLORIDE 0.9 % IV SOLN: 500.0000 mL | Freq: Once | INTRAVENOUS | Status: DC

## 2018-09-03 NOTE — Progress Notes (Signed)
To PACU, VSS. Report to [email or web address].tb 

## 2018-09-03 NOTE — Op Note (Signed)
[facility] Patient Name: Amber Baker Procedure Date: 09/03/2018 10:38 AM MRN: [id number] Endoscopist: Iva Booparl E Lameeka Schleifer , MD Age: [age] Referring MD:  Date of Birth: 10/13/1965 Gender: Female Account #: [id number] Procedure:                Colonoscopy Indications:              Screening for colorectal malignant neoplasm, This                            is the patient's first colonoscopy Medicines:                Propofol per Anesthesia, Monitored Anesthesia Care Procedure:                Pre-Anesthesia Assessment:                           - Prior to the procedure, a History and Physical                            was performed, and patient medications and                            allergies were reviewed. The patient's tolerance of                            previous anesthesia was also reviewed. The risks                            and benefits of the procedure and the sedation                            options and risks were discussed with the patient.                            All questions were answered, and informed consent                            was obtained. Prior Anticoagulants: The patient has                            taken no previous anticoagulant or antiplatelet                            agents. ASA Grade Assessment: II - A patient with                            mild systemic disease. After reviewing the risks                            and benefits, the patient was deemed in                            satisfactory condition to undergo the procedure.  After obtaining informed consent, the colonoscope                            was passed under direct vision. Throughout the                            procedure, the patient's blood pressure, pulse, and                            oxygen saturations were monitored continuously. The                            Colonoscope was introduced through the anus and   advanced to the the cecum, identified by                            appendiceal orifice and ileocecal valve. The                            quality of the bowel preparation was good. The                            colonoscopy was performed without difficulty. The                            patient tolerated the procedure well. The bowel                            preparation used was Miralax. Scope In: 10:40:39 AM Scope Out: 10:55:07 AM Scope Withdrawal Time: 0 hours 10 minutes 58 seconds  Total Procedure Duration: 0 hours 14 minutes 28 seconds  Findings:                 The perianal and digital rectal examinations were                            normal.                           The colon (entire examined portion) appeared normal.                           No additional abnormalities were found on                            retroflexion. Complications:            No immediate complications. Estimated blood loss:                            None. Estimated Blood Loss:     Estimated blood loss: none. Recommendation:           - Repeat colonoscopy or other appropriate test in                            10 years for screening purposes.                           -  Patient has a contact number available for                            emergencies. The signs and symptoms of potential                            delayed complications were discussed with the                            patient. Return to normal activities tomorrow.                            Written discharge instructions were provided to the                            patient.                           - Resume previous diet.                           - Continue present medications. Iva Boop, MD 09/03/2018 11:01:47 AM This report has been signed electronically.

## 2018-09-03 NOTE — Progress Notes (Signed)
Pt's states no medical or surgical changes since previsit or office visit. 

## 2018-09-03 NOTE — Patient Instructions (Addendum)
   Your colonoscopy was normal!  Next routine colonoscopy or other screening test in 10 years - 2029  I appreciate the opportunity to care for you. Iva Booparl E. , MD, FACG   YOU HAD AN ENDOSCOPIC PROCEDURE TODAY AT THE [facility]:   Refer to the procedure report that was given to you for any specific questions about what was found during the examination.  If the procedure report does not answer your questions, please call your gastroenterologist to clarify.  If you requested that your care partner not be given the details of your procedure findings, then the procedure report has been included in a sealed envelope for you to review at your convenience later.  YOU SHOULD EXPECT: Some feelings of bloating in the abdomen. Passage of more gas than usual.  Walking can help get rid of the air that was put into your GI tract during the procedure and reduce the bloating. If you had a lower endoscopy (such as a colonoscopy or flexible sigmoidoscopy) you may notice spotting of blood in your stool or on the toilet paper. If you underwent a bowel prep for your procedure, you may not have a normal bowel movement for a few days.  Please Note:  You might notice some irritation and congestion in your nose or some drainage.  This is from the oxygen used during your procedure.  There is no need for concern and it should clear up in a day or so.  SYMPTOMS TO REPORT IMMEDIATELY:   Following lower endoscopy (colonoscopy or flexible sigmoidoscopy):  Excessive amounts of blood in the stool  Significant tenderness or worsening of abdominal pains  Swelling of the abdomen that is new, acute  Fever of 100F or higher  For urgent or emergent issues, a gastroenterologist can be reached at any hour by calling [phone].   DIET:  We do recommend a small meal at first, but then you may proceed to your regular diet.  Drink plenty of fluids but you should avoid alcoholic beverages for 24  hours.  ACTIVITY:  You should plan to take it easy for the rest of today and you should NOT DRIVE or use heavy machinery until tomorrow (because of the sedation medicines used during the test).    FOLLOW UP: Our staff will call the number listed on your records the next business day following your procedure to check on you and address any questions or concerns that you may have regarding the information given to you following your procedure. If we do not reach you, we will leave a message.  However, if you are feeling well and you are not experiencing any problems, there is no need to return our call.  We will assume that you have returned to your regular daily activities without incident.  If any biopsies were taken you will be contacted by phone or by letter within the next 1-3 weeks.  Please call us at [phone] if you have not heard about the biopsies in 3 weeks.    SIGNATURES/CONFIDENTIALITY: You and/or your care partner have signed paperwork which will be entered into your electronic medical record.  These signatures attest to the fact that that the information above on your After Visit Summary has been reviewed and is understood.  Full responsibility of the confidentiality of this discharge information lies with you and/or your care-partner.

## 2018-09-04 ENCOUNTER — Telehealth: Payer: Self-pay | Admitting: *Deleted

## 2018-09-04 ENCOUNTER — Encounter: Payer: Self-pay | Admitting: Family Medicine

## 2018-09-04 NOTE — Telephone Encounter (Signed)
  Follow up Call-  Call back number 09/03/2018  Post procedure Call Back phone  # [phone]  Permission to leave phone message Yes  Some recent data might be hidden     Patient questions:  Do you have a fever, pain , or abdominal swelling? No. Pain Score  0 *  Have you tolerated food without any problems? Yes.    Have you been able to return to your normal activities? Yes.    Do you have any questions about your discharge instructions: Diet   No. Medications  No. Follow up visit  No.  Do you have questions or concerns about your Care? No.  Actions: * If pain score is 4 or above: No action needed, pain <4.

## 2018-09-19 ENCOUNTER — Other Ambulatory Visit: Payer: Self-pay | Admitting: Emergency Medicine

## 2018-09-19 DIAGNOSIS — I10 Essential (primary) hypertension: Secondary | ICD-10-CM

## 2018-09-19 MED ORDER — RAMIPRIL 2.5 MG PO CAPS: 2.5000 mg | ORAL_CAPSULE | Freq: Every day | ORAL | 1 refills | Status: DC

## 2018-11-08 ENCOUNTER — Other Ambulatory Visit: Payer: Self-pay | Admitting: Family Medicine

## 2018-11-08 DIAGNOSIS — G43709 Chronic migraine without aura, not intractable, without status migrainosus: Secondary | ICD-10-CM

## 2018-11-08 MED ORDER — SUMATRIPTAN SUCCINATE 50 MG PO TABS: ORAL_TABLET | ORAL | 1 refills | Status: DC

## 2018-12-18 ENCOUNTER — Encounter: Payer: Self-pay | Admitting: Family Medicine

## 2018-12-18 ENCOUNTER — Other Ambulatory Visit: Payer: Self-pay | Admitting: Family Medicine

## 2018-12-18 DIAGNOSIS — G43709 Chronic migraine without aura, not intractable, without status migrainosus: Secondary | ICD-10-CM

## 2018-12-18 MED ORDER — SUMATRIPTAN SUCCINATE 50 MG PO TABS: ORAL_TABLET | ORAL | 2 refills | Status: DC

## 2019-01-25 ENCOUNTER — Other Ambulatory Visit: Payer: Self-pay | Admitting: Family Medicine

## 2019-01-25 DIAGNOSIS — I10 Essential (primary) hypertension: Secondary | ICD-10-CM

## 2019-01-25 DIAGNOSIS — R7303 Prediabetes: Secondary | ICD-10-CM

## 2019-01-25 NOTE — Progress Notes (Signed)
Labs entered for 6 month follow-up

## 2019-01-28 ENCOUNTER — Other Ambulatory Visit: Payer: Self-pay

## 2019-01-28 ENCOUNTER — Other Ambulatory Visit (INDEPENDENT_AMBULATORY_CARE_PROVIDER_SITE_OTHER): Payer: BC Managed Care – PPO

## 2019-01-28 DIAGNOSIS — R7303 Prediabetes: Secondary | ICD-10-CM | POA: Diagnosis not present

## 2019-01-28 DIAGNOSIS — I10 Essential (primary) hypertension: Secondary | ICD-10-CM

## 2019-01-28 LAB — BASIC METABOLIC PANEL
BUN: 15 mg/dL (ref 6–23)
CO2: 26 mEq/L (ref 19–32)
Calcium: 9.3 mg/dL (ref 8.4–10.5)
Chloride: 106 mEq/L (ref 96–112)
Creatinine, Ser: 0.78 mg/dL (ref 0.40–1.20)
GFR: 77.47 mL/min (ref >60.00)
Glucose, Bld: 101 mg/dL — ABNORMAL HIGH (ref 70–99)
Potassium: 4.4 mEq/L (ref 3.5–5.1)
Sodium: 139 mEq/L (ref 135–145)

## 2019-01-28 LAB — HEMOGLOBIN A1C: Hgb A1c MFr Bld: 6.1 % (ref 4.6–6.5)

## 2019-02-13 ENCOUNTER — Encounter: Payer: Self-pay | Admitting: Family Medicine

## 2019-03-20 ENCOUNTER — Other Ambulatory Visit: Payer: Self-pay | Admitting: Family Medicine

## 2019-03-20 DIAGNOSIS — I10 Essential (primary) hypertension: Secondary | ICD-10-CM

## 2019-07-02 ENCOUNTER — Other Ambulatory Visit: Payer: Self-pay | Admitting: Family Medicine

## 2019-07-02 DIAGNOSIS — G43709 Chronic migraine without aura, not intractable, without status migrainosus: Secondary | ICD-10-CM

## 2019-07-02 NOTE — Telephone Encounter (Signed)
Last filled on 12/18/2018 #9 with 2 refills. LOV 07/23/2018 for HTN.  No future appointments made.

## 2019-08-05 ENCOUNTER — Other Ambulatory Visit: Payer: Self-pay

## 2019-08-05 DIAGNOSIS — Z20822 Contact with and (suspected) exposure to covid-19: Secondary | ICD-10-CM

## 2019-08-06 LAB — NOVEL CORONAVIRUS, NAA: SARS-CoV-2, NAA: NOT DETECTED

## 2019-09-30 ENCOUNTER — Other Ambulatory Visit: Payer: Self-pay | Admitting: Family Medicine

## 2019-09-30 DIAGNOSIS — I10 Essential (primary) hypertension: Secondary | ICD-10-CM

## 2019-10-28 ENCOUNTER — Other Ambulatory Visit: Payer: Self-pay | Admitting: Podiatry

## 2019-10-28 ENCOUNTER — Ambulatory Visit: Payer: BC Managed Care – PPO | Admitting: Podiatry

## 2019-10-28 ENCOUNTER — Encounter: Payer: Self-pay | Admitting: Podiatry

## 2019-10-28 ENCOUNTER — Ambulatory Visit (INDEPENDENT_AMBULATORY_CARE_PROVIDER_SITE_OTHER): Payer: BC Managed Care – PPO

## 2019-10-28 ENCOUNTER — Other Ambulatory Visit: Payer: Self-pay

## 2019-10-28 DIAGNOSIS — M778 Other enthesopathies, not elsewhere classified: Secondary | ICD-10-CM

## 2019-10-28 DIAGNOSIS — M779 Enthesopathy, unspecified: Secondary | ICD-10-CM

## 2019-10-28 NOTE — Progress Notes (Signed)
Subjective:  Patient ID: Amber Baker, female    DOB: 11/05/1965,  MRN: [id number] HPI Chief Complaint  Patient presents with  . 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 New Patient (Initial Visit)    54 y.o. female presents with the above complaint.   ROS: Denies fever chills nausea vomiting muscle aches pains calf pain back pain chest pain shortness of breath.  Past Medical History:  Diagnosis Date  . Abdominal pain, generalized   . Abnormal weight gain   . Acute upper respiratory infections of unspecified site   . Allergy   . Essential hypertension, benign   . Gastroenteritis   . Migraine, unspecified, without mention of intractable migraine without mention of status migrainosus   . Routine general medical examination at a health care facility    Past Surgical History:  Procedure Laterality Date  . CHOLECYSTECTOMY    . OVARY SURGERY Left     Current Outpatient Medications:  .  Aspirin-Salicylamide-Caffeine (BC HEADACHE POWDER PO), Take by mouth., Disp: , Rfl:  .  Multiple Vitamins-Minerals (WOMENS MULTI GUMMIES PO), Take by mouth., Disp: , Rfl:  .  naproxen sodium (ALEVE) 220 MG tablet, Take 220 mg by mouth., Disp: , Rfl:  .  ibuprofen (ADVIL,MOTRIN) 200 MG tablet, Take 200 mg by mouth every 6 (six) hours as needed., Disp: , Rfl:  .  ramipril (ALTACE) 2.5 MG capsule, TAKE 1 CAPSULE BY MOUTH ONCE DAILY, Disp: 30 capsule, Rfl: 0 .  SUMAtriptan (IMITREX) 50 MG tablet, TAKE 1 TABLET BY MOUTH AS NEEDED FOR MIGRAINE. MAY REPEAT IN 2 HOURS IF NEEDED. Belton Peplinski OF 2 TABLETS IN 24 HOURS., Disp: 9 tablet, Rfl: 2  Current Facility-Administered Medications:  .  cyanocobalamin ((VITAMIN B-12)) injection 1,000 mcg, 1,000 mcg, Intramuscular, Once, Dianne Dun, MD  Allergies  Allergen Reactions  . Penicillins       REACTION: unknown   Review of Systems Objective:  There were no vitals filed for this visit.  General: Well developed, nourished, in no acute distress, alert and oriented x3   Dermatological: Skin is warm, dry and supple bilateral. Nails x 10 are well maintained; remaining integument appears unremarkable at this time. There are no open sores, no preulcerative lesions, no rash or signs of infection present.  Vascular: Dorsalis Pedis artery and Posterior Tibial artery pedal pulses are 2/4 bilateral with immedate capillary fill time. Pedal hair growth present. No varicosities and no lower extremity edema present bilateral.   Neruologic: Grossly intact via light touch bilateral. Vibratory intact via tuning fork bilateral. Protective threshold with Semmes Wienstein monofilament intact to all pedal sites bilateral. Patellar and Achilles deep tendon reflexes 2+ bilateral. No Babinski or clonus noted bilateral.   Musculoskeletal: No gross boney pedal deformities bilateral. No pain, crepitus, or limitation noted with foot and ankle range of motion bilateral. Muscular strength 5/5 in all groups tested bilateral.  Moderate to severe pain on palpation of the second TMT J.  Gait: Unassisted, Nonantalgic.    Radiographs:  Radiographs taken today demonstrate osseously mature individual.  Plantar distally oriented calcaneal heel spur with very mild increase in density at the plantar fashion calcaneal insertion site right.  Some dorsal spurring and joint space narrowing at the second TMT.  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  Plan: Discussed etiology pathology conservative surgical therapies.  Discussed appropriate shoe gear stretching exercise ice therapy and shoe gear modifications.  At this point injected 20 mg Kenalog 5 mg of Marcaine across the dorsal aspect of the foot after sterile Betadine skin prep.  Tolerated procedure well without  complications.  Follow-up with her in about a month.     Amber Baker T. [place], [place]

## 2019-10-29 ENCOUNTER — Other Ambulatory Visit: Payer: Self-pay | Admitting: Family Medicine

## 2019-10-29 DIAGNOSIS — I10 Essential (primary) hypertension: Secondary | ICD-10-CM

## 2019-10-30 NOTE — Telephone Encounter (Signed)
Attempted to reach patient. No answer. Vm left to call back. Pt needs an OV

## 2019-10-30 NOTE — Telephone Encounter (Signed)
Per chart pt has been scheduled to see Debbie on 11/11/2019. Will send in #30 to get pt to appointment

## 2019-11-11 ENCOUNTER — Other Ambulatory Visit (HOSPITAL_COMMUNITY)
Admission: RE | Admit: 2019-11-11 | Discharge: 2019-11-11 | Disposition: A | Discharge status: Home / Self Care | Payer: BC Managed Care – PPO | Source: Ambulatory Visit | Attending: Family Medicine | Admitting: Family Medicine

## 2019-11-11 ENCOUNTER — Ambulatory Visit (INDEPENDENT_AMBULATORY_CARE_PROVIDER_SITE_OTHER): Payer: BC Managed Care – PPO | Admitting: Family Medicine

## 2019-11-11 ENCOUNTER — Other Ambulatory Visit: Payer: Self-pay

## 2019-11-11 ENCOUNTER — Encounter: Payer: Self-pay | Admitting: Family Medicine

## 2019-11-11 VITALS — BP 142/80 | HR 81 | Temp 98.0°F | Ht 64.25 in | Wt 191.1 lb

## 2019-11-11 DIAGNOSIS — Z124 Encounter for screening for malignant neoplasm of cervix: Secondary | ICD-10-CM

## 2019-11-11 DIAGNOSIS — R7303 Prediabetes: Secondary | ICD-10-CM | POA: Diagnosis not present

## 2019-11-11 DIAGNOSIS — Z6832 Body mass index (BMI) 32.0-32.9, adult: Secondary | ICD-10-CM

## 2019-11-11 DIAGNOSIS — Z0000 Encounter for general adult medical examination without abnormal findings: Secondary | ICD-10-CM

## 2019-11-11 DIAGNOSIS — I10 Essential (primary) hypertension: Secondary | ICD-10-CM | POA: Diagnosis not present

## 2019-11-11 DIAGNOSIS — E6609 Other obesity due to excess calories: Secondary | ICD-10-CM

## 2019-11-11 DIAGNOSIS — G43709 Chronic migraine without aura, not intractable, without status migrainosus: Secondary | ICD-10-CM

## 2019-11-11 LAB — CBC WITH DIFFERENTIAL/PLATELET
Basophils Absolute: 0 10*3/uL (ref 0.0–0.1)
Basophils Relative: 0.7 % (ref 0.0–3.0)
Eosinophils Absolute: 0 10*3/uL (ref 0.0–0.7)
Eosinophils Relative: 0.8 % (ref 0.0–5.0)
HCT: 44 % (ref 36.0–46.0)
Hemoglobin: 14.8 g/dL (ref 12.0–15.0)
Lymphocytes Relative: 25.3 % (ref 12.0–46.0)
Lymphs Abs: 1.5 10*3/uL (ref 0.7–4.0)
MCHC: 33.5 g/dL (ref 30.0–36.0)
MCV: 89 fl (ref 78.0–100.0)
Monocytes Absolute: 0.5 10*3/uL (ref 0.1–1.0)
Monocytes Relative: 9.5 % (ref 3.0–12.0)
Neutro Abs: 3.7 10*3/uL (ref 1.4–7.7)
Neutrophils Relative %: 63.7 % (ref 43.0–77.0)
Platelets: 352 10*3/uL (ref 150.0–400.0)
RBC: 4.95 Mil/uL (ref 3.87–5.11)
RDW: 13.6 % (ref 11.5–15.5)
WBC: 5.8 10*3/uL (ref 4.0–10.5)

## 2019-11-11 LAB — COMPREHENSIVE METABOLIC PANEL
ALT: 23 U/L (ref 0–35)
AST: 22 U/L (ref 0–37)
Albumin: 4.7 g/dL (ref 3.5–5.2)
Alkaline Phosphatase: 78 U/L (ref 39–117)
BUN: 14 mg/dL (ref 6–23)
CO2: 28 mEq/L (ref 19–32)
Calcium: 9.7 mg/dL (ref 8.4–10.5)
Chloride: 103 mEq/L (ref 96–112)
Creatinine, Ser: 0.82 mg/dL (ref 0.40–1.20)
GFR: 72.9 mL/min (ref >60.00)
Glucose, Bld: 97 mg/dL (ref 70–99)
Potassium: 4.1 mEq/L (ref 3.5–5.1)
Sodium: 139 mEq/L (ref 135–145)
Total Bilirubin: 0.4 mg/dL (ref 0.2–1.2)
Total Protein: 7.8 g/dL (ref 6.0–8.3)

## 2019-11-11 LAB — LIPID PANEL
Cholesterol: 164 mg/dL (ref 0–200)
HDL: 51.7 mg/dL (ref >39.00)
LDL Cholesterol: 97 mg/dL (ref 0–99)
NonHDL: 112.47
Total CHOL/HDL Ratio: 3
Triglycerides: 75 mg/dL (ref 0.0–149.0)
VLDL: 15 mg/dL (ref 0.0–40.0)

## 2019-11-11 LAB — HEMOGLOBIN A1C: Hgb A1c MFr Bld: 6.1 % (ref 4.6–6.5)

## 2019-11-11 LAB — TSH: TSH: 0.87 u[IU]/mL (ref 0.35–4.50)

## 2019-11-11 MED ORDER — RAMIPRIL 2.5 MG PO CAPS: 2.5000 mg | ORAL_CAPSULE | Freq: Every day | ORAL | 3 refills | Status: DC

## 2019-11-11 NOTE — Progress Notes (Signed)
Subjective:    Patient ID: Amber Baker, female    DOB: [birth year], 54 y.o.   MRN: [id number]  HPI This is a 54 yo female who presents today for CPE. She lives with daughter 72 yo (getting married 10/21), husband. Works as a [employer] manager, has been slow with pandemic.   Last CPE- 06/18/2018 Mammo- 09/05/2016 Pap- due Colonoscopy- 09/03/2018 Tdap- 10/30/2017 Flu- declines Eye- within last year Dental- regular Exercise- walks dog 2x/ day  Headaches- usually monthly, soda is a trigger. Relieved with sumatriptan.   Diet- breakfast- chocolate milk/ bagel, ricotta cheese, cereal. Lunch- salad, GF pizza, sandwich, fast food. Dinner- beef, chicken, shrimp, vegetables, starch. Snacks- chocolate, chips. Time is usually biggest barrier to making healthy food choices.    Review of Systems  Constitutional: Negative.   HENT: Negative.   Eyes: Negative.   Respiratory: Negative.   Cardiovascular: Negative.   Gastrointestinal: Negative.   Endocrine: Negative.   Genitourinary: Positive for vaginal pain (some dryness with intercourese, uses otc lubicants with improvement).  Skin:       Has had some hair breakage.   Allergic/Immunologic: Negative.   Neurological: Positive for headaches (chronic).  Hematological: Negative.   Psychiatric/Behavioral: Positive for sleep disturbance (recently with death of ex SIL). Negative for dysphoric mood.       Objective:   Physical Exam Physical Exam  Constitutional: She is oriented to person, place, and time. She appears well-developed and well-nourished. No distress.  HENT:  Head: Normocephalic and atraumatic.  Right Ear: External ear normal.  Left Ear: External ear normal.  Nose: Nose normal.  Mouth/Throat: Oropharynx is clear and moist. No oropharyngeal exudate.  Eyes: Conjunctivae are normal.  Neck: Normal range of motion. Neck supple. No JVD present. No thyromegaly present.  Cardiovascular: Normal rate, regular rhythm, normal heart sounds and  intact distal pulses.   Pulmonary/Chest: Effort normal and breath sounds normal. Right breast exhibits no inverted nipple, no mass, no nipple discharge, no skin change and no tenderness. Left breast exhibits no inverted nipple, no mass, no nipple discharge, no skin change and no tenderness. Breasts are symmetrical.  Abdominal: Soft. Bowel sounds are normal. She exhibits no distension and no mass. There is no tenderness. There is no rebound and no guarding.  Genitourinary: Vagina normal. Pelvic exam was performed with patient supine. There is no rash, tenderness, lesion or injury on the right labia. There is no rash, tenderness, lesion or injury on the left labia. Cervix exhibits no motion tenderness and no discharge. No vaginal discharge found.  Musculoskeletal: Normal range of motion. She exhibits no edema or tenderness.  Lymphadenopathy:    She has no cervical adenopathy.  Neurological: She is alert and oriented to person, place, and time. Skin: Skin is warm and dry. She is not diaphoretic.  Psychiatric: She has a normal mood and affect. Her behavior is normal. Judgment and thought content normal.  Vitals reviewed.     BP (!) 142/80 (BP Location: Left Arm, Patient Position: Sitting, Cuff Size: Normal)   Pulse 81   Temp 98 F (36.7 C) (Temporal)   Ht 5' 4.25" (1.632 m)   Wt 191 lb 1.9 oz (86.7 kg)   LMP 03/24/2014   SpO2 94%   BMI 32.55 kg/m  Wt Readings from Last 3 Encounters:  11/11/19 191 lb 1.9 oz (86.7 kg)  09/03/18 187 lb (84.8 kg)  08/20/18 190 lb (86.2 kg)   Depression screen [name] 2/9 11/11/2019 07/23/2018  Decreased Interest 0 0  Down, Depressed,  Hopeless 0 0  PHQ - 2 Score 0 0       Assessment & Plan:  1. Annual physical exam - Discussed and encouraged healthy lifestyle choices- adequate sleep, regular exercise, stress management and healthy food choices.  - provided her phone number to schedule mammogram  2. Essential hypertension - continue current medication -  Lipid Panel - Hemoglobin A1c - Comprehensive metabolic panel - CBC with Differential - TSH - ramipril (ALTACE) 2.5 MG capsule; Take 1 capsule (2.5 mg total) by mouth daily.  Dispense: 90 capsule; Refill: 3  3. Prediabetes - Lipid Panel - Hemoglobin A1c - Comprehensive metabolic panel - CBC with Differential - TSH  4. Screening for cervical cancer - Cytology - PAP([place])  5. Chronic migraine without aura without status migrainosus, not intractable - good relief with occasional sumatriptan.  - discussed avoiding triggers  6. Class 1 obesity due to excess calories without serious comorbidity with body mass index (BMI) of 32.0 to 32.9 in adult - provided information regarding healthy food choices, discussed meal planning  - follow up in 6 months  This visit occurred during the SARS-CoV-2 public health emergency.  Safety protocols were in place, including screening questions prior to the visit, additional usage of staff PPE, and extensive cleaning of exam room while observing appropriate contact time as indicated for disinfecting solutions.      Olean Ree, FNP-BC  [facility], [facility]  11/11/2019 10:32 AM

## 2019-11-11 NOTE — Patient Instructions (Addendum)
Please call and schedule an appointment for screening mammogram. A referral is not needed.  [phone]   Please follow up in 6 months  A resource that I like is [email or web address]  Here are some guidelines to help you with meal planning -  Avoid all processed and packaged foods (bread, pasta, crackers, chips, etc) and beverages containing calories.  Avoid added sugars and excessive natural sugars.  Attention to how you feel if you consume artificial sweeteners.  Do they make you more hungry or raise your blood sugar?  With every meal and snack, aim to get 20 g of protein (3 ounces of meat, 4 ounces of fish, 3 eggs, protein powder, 1 cup [place] yogurt, 1 cup cottage cheese, etc.)  Increase fiber in the form of non-starchy vegetables.  These help you feel full with very little carbohydrates and are good for gut health.  Eat 1 serving healthy carb per meal- 1/2 cup brown rice, beans, potato, corn- pay attention to whether or not this significantly raises your blood sugar. If it does, reduce the frequency you consume these.   Eat 2-3 servings of lower sugar fruits daily.  This includes berries, apples, oranges, peaches, pears, one half banana.  Have small amounts of good fats such as avocado, nuts, olive oil, nut butters, olives.  Add a little cheese to your salads to make them tasty.    Health Maintenance for Postmenopausal Women Menopause is a normal process in which your ability to get pregnant comes to an end. This process happens slowly over many months or years, usually between the ages of 60 and 63. Menopause is complete when you have missed your menstrual periods for 12 months. It is important to talk with your health care provider about some of the most common conditions that affect women after menopause (postmenopausal women). These include heart disease, cancer, and bone loss (osteoporosis). Adopting a healthy lifestyle and getting preventive care can help to  promote your health and wellness. The actions you take can also lower your chances of developing some of these common conditions. What should I know about menopause? During menopause, you may get a number of symptoms, such as:  Hot flashes. These can be moderate or severe.  Night sweats.  Decrease in sex drive.  Mood swings.  Headaches.  Tiredness.  Irritability.  Memory problems.  Insomnia. Choosing to treat or not to treat these symptoms is a decision that you make with your health care provider. Do I need hormone replacement therapy?  Hormone replacement therapy is effective in treating symptoms that are caused by menopause, such as hot flashes and night sweats.  Hormone replacement carries certain risks, especially as you become older. If you are thinking about using estrogen or estrogen with progestin, discuss the benefits and risks with your health care provider. What is my risk for heart disease and stroke? The risk of heart disease, heart attack, and stroke increases as you age. One of the causes may be a change in the body's hormones during menopause. This can affect how your body uses dietary fats, triglycerides, and cholesterol. Heart attack and stroke are medical emergencies. There are many things that you can do to help prevent heart disease and stroke. Watch your blood pressure  High blood pressure causes heart disease and increases the risk of stroke. This is more likely to develop in people who have high blood pressure readings, are of African descent, or are overweight.  Have your blood pressure checked: ? Every 3-5  years if you are 44-67 years of age. ? Every year if you are 31 years old or older. Eat a healthy diet   Eat a diet that includes plenty of vegetables, fruits, low-fat dairy products, and lean protein.  Do not eat a lot of foods that are high in solid fats, added sugars, or sodium. Get regular exercise Get regular exercise. This is one of the  most important things you can do for your health. Most adults should:  Try to exercise for at least 150 minutes each week. The exercise should increase your heart rate and make you sweat (moderate-intensity exercise).  Try to do strengthening exercises at least twice each week. Do these in addition to the moderate-intensity exercise.  Spend less time sitting. Even light physical activity can be beneficial. Other tips  Work with your health care provider to achieve or maintain a healthy weight.  Do not use any products that contain nicotine or tobacco, such as cigarettes, e-cigarettes, and chewing tobacco. If you need help quitting, ask your health care provider.  Know your numbers. Ask your health care provider to check your cholesterol and your blood sugar (glucose). Continue to have your blood tested as directed by your health care provider. Do I need screening for cancer? Depending on your health history and family history, you may need to have cancer screening at different stages of your life. This may include screening for:  Breast cancer.  Cervical cancer.  Lung cancer.  Colorectal cancer. What is my risk for osteoporosis? After menopause, you may be at increased risk for osteoporosis. Osteoporosis is a condition in which bone destruction happens more quickly than new bone creation. To help prevent osteoporosis or the bone fractures that can happen because of osteoporosis, you may take the following actions:  If you are 28-18 years old, get at least 1,000 mg of calcium and at least 600 mg of vitamin D per day.  If you are older than age 56 but younger than age 23, get at least 1,200 mg of calcium and at least 600 mg of vitamin D per day.  If you are older than age 27, get at least 1,200 mg of calcium and at least 800 mg of vitamin D per day. Smoking and drinking excessive alcohol increase the risk of osteoporosis. Eat foods that are rich in calcium and vitamin D, and do  weight-bearing exercises several times each week as directed by your health care provider. How does menopause affect my mental health? Depression may occur at any age, but it is more common as you become older. Common symptoms of depression include:  Low or sad mood.  Changes in sleep patterns.  Changes in appetite or eating patterns.  Feeling an overall lack of motivation or enjoyment of activities that you previously enjoyed.  Frequent crying spells. Talk with your health care provider if you think that you are experiencing depression. General instructions See your health care provider for regular wellness exams and vaccines. This may include:  Scheduling regular health, dental, and eye exams.  Getting and maintaining your vaccines. These include: ? Influenza vaccine. Get this vaccine each year before the flu season begins. ? Pneumonia vaccine. ? Shingles vaccine. ? Tetanus, diphtheria, and pertussis (Tdap) booster vaccine. Your health care provider may also recommend other immunizations. Tell your health care provider if you have ever been abused or do not feel safe at home. Summary  Menopause is a normal process in which your ability to get pregnant comes  to an end.  This condition causes hot flashes, night sweats, decreased interest in sex, mood swings, headaches, or lack of sleep.  Treatment for this condition may include hormone replacement therapy.  Take actions to keep yourself healthy, including exercising regularly, eating a healthy diet, watching your weight, and checking your blood pressure and blood sugar levels.  Get screened for cancer and depression. Make sure that you are up to date with all your vaccines. This information is not intended to replace advice given to you by your health care provider. Make sure you discuss any questions you have with your health care provider. Document Revised: 09/19/2018 Document Reviewed: 09/19/2018 Elsevier Patient Education   2020 [employer].

## 2019-11-12 LAB — CYTOLOGY - PAP
Comment: NEGATIVE
Diagnosis: NEGATIVE
High risk HPV: NEGATIVE

## 2019-11-25 ENCOUNTER — Ambulatory Visit: Payer: BC Managed Care – PPO | Admitting: Podiatry

## 2019-12-17 ENCOUNTER — Other Ambulatory Visit: Payer: Self-pay | Admitting: Family Medicine

## 2019-12-17 DIAGNOSIS — G43709 Chronic migraine without aura, not intractable, without status migrainosus: Secondary | ICD-10-CM

## 2019-12-19 ENCOUNTER — Encounter: Payer: Self-pay | Admitting: Family Medicine

## 2019-12-19 NOTE — Telephone Encounter (Signed)
Upcoming OV With Family Medicine Emi Belfast, FNP) 05/18/2020 at 9:15 AM Last OV 11/11/2019 Last refill 07/03/19 #9 x 2 refills.   Please advise, thanks.

## 2020-01-06 LAB — HM MAMMOGRAPHY

## 2020-01-23 ENCOUNTER — Encounter: Payer: Self-pay | Admitting: Family Medicine

## 2020-02-12 ENCOUNTER — Other Ambulatory Visit: Payer: Self-pay

## 2020-02-12 ENCOUNTER — Ambulatory Visit: Payer: BC Managed Care – PPO | Admitting: Podiatry

## 2020-02-12 ENCOUNTER — Encounter: Payer: Self-pay | Admitting: Podiatry

## 2020-02-12 DIAGNOSIS — M778 Other enthesopathies, not elsewhere classified: Secondary | ICD-10-CM

## 2020-02-12 MED ORDER — MELOXICAM 15 MG PO TABS: 15.0000 mg | ORAL_TABLET | Freq: Every day | ORAL | 3 refills | Status: DC

## 2020-02-12 NOTE — Progress Notes (Signed)
She presents today for follow-up of her capsulitis right.  States that is better than it was.  She states is about 50% better than but noticing is starting to hurt a little bit again.  Objective: Pulses are palpable.  She has pain dorsal aspect of the right foot.  Assessment: Capsulitis neuritis dorsal aspect right foot.  Plan: I injected the area today with 20 mg Kenalog 5 mg Marcaine point maximal tenderness.  Tolerated procedure well without complications follow-up with me as needed

## 2020-05-18 ENCOUNTER — Ambulatory Visit: Payer: BC Managed Care – PPO | Admitting: Family Medicine

## 2020-05-18 ENCOUNTER — Encounter: Payer: Self-pay | Admitting: Family Medicine

## 2020-05-18 ENCOUNTER — Other Ambulatory Visit: Payer: Self-pay

## 2020-05-18 VITALS — BP 144/88 | HR 78 | Temp 97.6°F | Ht 64.0 in | Wt 188.0 lb

## 2020-05-18 DIAGNOSIS — E6609 Other obesity due to excess calories: Secondary | ICD-10-CM

## 2020-05-18 DIAGNOSIS — Z6832 Body mass index (BMI) 32.0-32.9, adult: Secondary | ICD-10-CM | POA: Diagnosis not present

## 2020-05-18 DIAGNOSIS — I10 Essential (primary) hypertension: Secondary | ICD-10-CM | POA: Diagnosis not present

## 2020-05-18 DIAGNOSIS — G43709 Chronic migraine without aura, not intractable, without status migrainosus: Secondary | ICD-10-CM | POA: Diagnosis not present

## 2020-05-18 MED ORDER — RAMIPRIL 5 MG PO CAPS: 5.0000 mg | ORAL_CAPSULE | Freq: Every day | ORAL | 3 refills | Status: DC

## 2020-05-18 NOTE — Patient Instructions (Signed)
Good to see you today  Please send me a copy of your labs from [facility], I'll put them in your record  Follow up in 6 months for your annual exam  Bring your blood pressure cuff and log of readings.   Increase your blood pressure medication to 5 mg. Can take 2 of the 2.5 mg until you run out. I have sent in a new prescription for the 5 mg dose to your pharmacy.   Goat is <140/90, let me know if not decreased in 2 weeks

## 2020-05-18 NOTE — Progress Notes (Signed)
   Subjective:    Patient ID: Amber Baker, female    DOB: 1966/07/09, 54 y.o.   MRN: [id number]  HPI Chief Complaint  Patient presents with  . Hypertension    Pt stated--possible need to go up Rx for blood pressure been elevated.   This is a 54 yo female who presents today for follow up of chronic medical conditions.   Has been going to [facility]. Started on multiple supplements. Good relief of headaches. Had intolerance to glutin, casein, eggs and has been avoiding these products. Has been difficult to restrict so much so she has started drinking more sodas. Usually 20 ounces a day, with each meal. Little water intake.   Decrease in number of hot flashes. Hair loss improved.   Walking the dog twice a day.   HTN- bought a new blood pressure cuff but it seems too big. Had some high readings at [facility].    Review of Systems Per HPI    Objective:   Physical Exam Constitutional:      General: She is not in acute distress.    Appearance: Normal appearance. She is obese. She is not ill-appearing, toxic-appearing or diaphoretic.  HENT:     Head: Normocephalic and atraumatic.  Cardiovascular:     Rate and Rhythm: Normal rate.  Pulmonary:     Effort: Pulmonary effort is normal.  Neurological:     Mental Status: She is alert and oriented to person, place, and time.  Psychiatric:        Mood and Affect: Mood normal.        Behavior: Behavior normal.        Thought Content: Thought content normal.        Judgment: Judgment normal.       BP (!) 144/88   Pulse 78   Temp 97.6 F (36.4 C)   Ht 5\' 4"  (1.626 m)   Wt 188 lb (85.3 kg)   LMP 03/24/2014   SpO2 95%   BMI 32.27 kg/m  Wt Readings from Last 3 Encounters:  05/18/20 188 lb (85.3 kg)  11/11/19 191 lb 1.9 oz (86.7 kg)  09/03/18 187 lb (84.8 kg)       Assessment & Plan:  1. Essential hypertension Will increase ramipril, she will continue to monitor bp at home and send me message if no  improvement with increased dose.  - follow up in 6 months - ramipril (ALTACE) 5 MG capsule; Take 1 capsule (5 mg total) by mouth daily.  Dispense: 90 capsule; Refill: 3  2. Class 1 obesity due to excess calories without serious comorbidity with body mass index (BMI) of 32.0 to 32.9 in adult - has lost a few pounds, encouraged her to decrease sodas and simple starches  3. Chronic migraine without aura without status migrainosus, not intractable - improved with supplements, dietary changes.  This visit occurred during the SARS-CoV-2 public health emergency.  Safety protocols were in place, including screening questions prior to the visit, additional usage of staff PPE, and extensive cleaning of exam room while observing appropriate contact time as indicated for disinfecting solutions.      09/05/18, FNP-BC  [facility], [facility]  05/18/2020 9:49 AM

## 2020-06-04 ENCOUNTER — Encounter: Payer: Self-pay | Admitting: Family Medicine

## 2020-06-05 NOTE — Telephone Encounter (Signed)
Patient called to follow-up on the my chart message that she sent. Patient stated that her daughter and husband were tested and their test came back positive for covid. . Patient stated that she was not tested but is sure that she has covid also. Patient stated that she has not had her vaccines.

## 2020-06-11 ENCOUNTER — Inpatient Hospital Stay (HOSPITAL_COMMUNITY)
Admission: EM | Admit: 2020-06-11 | Discharge: 2020-06-16 | DRG: 177 | Disposition: A | Discharge status: Home / Self Care | Payer: BC Managed Care – PPO | Attending: Internal Medicine | Admitting: Internal Medicine

## 2020-06-11 ENCOUNTER — Other Ambulatory Visit: Payer: Self-pay

## 2020-06-11 ENCOUNTER — Encounter (HOSPITAL_COMMUNITY): Payer: Self-pay

## 2020-06-11 ENCOUNTER — Emergency Department (HOSPITAL_COMMUNITY): Payer: BC Managed Care – PPO

## 2020-06-11 DIAGNOSIS — Z880 Allergy status to penicillin: Secondary | ICD-10-CM

## 2020-06-11 DIAGNOSIS — Z79899 Other long term (current) drug therapy: Secondary | ICD-10-CM | POA: Diagnosis not present

## 2020-06-11 DIAGNOSIS — R112 Nausea with vomiting, unspecified: Secondary | ICD-10-CM | POA: Diagnosis present

## 2020-06-11 DIAGNOSIS — J9601 Acute respiratory failure with hypoxia: Secondary | ICD-10-CM | POA: Diagnosis present

## 2020-06-11 DIAGNOSIS — R197 Diarrhea, unspecified: Secondary | ICD-10-CM | POA: Diagnosis present

## 2020-06-11 DIAGNOSIS — U071 COVID-19: Principal | ICD-10-CM | POA: Diagnosis present

## 2020-06-11 DIAGNOSIS — Z7952 Long term (current) use of systemic steroids: Secondary | ICD-10-CM

## 2020-06-11 DIAGNOSIS — J1282 Pneumonia due to coronavirus disease 2019: Secondary | ICD-10-CM | POA: Diagnosis present

## 2020-06-11 DIAGNOSIS — R748 Abnormal levels of other serum enzymes: Secondary | ICD-10-CM | POA: Diagnosis not present

## 2020-06-11 DIAGNOSIS — I10 Essential (primary) hypertension: Secondary | ICD-10-CM | POA: Diagnosis present

## 2020-06-11 DIAGNOSIS — R7401 Elevation of levels of liver transaminase levels: Secondary | ICD-10-CM | POA: Diagnosis present

## 2020-06-11 DIAGNOSIS — Z791 Long term (current) use of non-steroidal anti-inflammatories (NSAID): Secondary | ICD-10-CM

## 2020-06-11 DIAGNOSIS — R531 Weakness: Secondary | ICD-10-CM | POA: Diagnosis present

## 2020-06-11 LAB — CBC WITH DIFFERENTIAL/PLATELET
Abs Immature Granulocytes: 0.03 10*3/uL (ref 0.00–0.07)
Basophils Absolute: 0 10*3/uL (ref 0.0–0.1)
Basophils Relative: 0 %
Eosinophils Absolute: 0 10*3/uL (ref 0.0–0.5)
Eosinophils Relative: 0 %
HCT: 48.8 % — ABNORMAL HIGH (ref 36.0–46.0)
Hemoglobin: 16.2 g/dL — ABNORMAL HIGH (ref 12.0–15.0)
Immature Granulocytes: 0 %
Lymphocytes Relative: 9 %
Lymphs Abs: 0.6 10*3/uL — ABNORMAL LOW (ref 0.7–4.0)
MCH: 29.3 pg (ref 26.0–34.0)
MCHC: 33.2 g/dL (ref 30.0–36.0)
MCV: 88.4 fL (ref 80.0–100.0)
Monocytes Absolute: 0.6 10*3/uL (ref 0.1–1.0)
Monocytes Relative: 8 %
Neutro Abs: 6.2 10*3/uL (ref 1.7–7.7)
Neutrophils Relative %: 83 %
Platelets: 261 10*3/uL (ref 150–400)
RBC: 5.52 MIL/uL — ABNORMAL HIGH (ref 3.87–5.11)
RDW: 12.8 % (ref 11.5–15.5)
WBC: 7.4 10*3/uL (ref 4.0–10.5)
nRBC: 0 % (ref 0.0–0.2)

## 2020-06-11 LAB — LACTIC ACID, PLASMA: Lactic Acid, Venous: 1.3 mmol/L (ref 0.5–1.9)

## 2020-06-11 LAB — CBC
HCT: 46 % (ref 36.0–46.0)
Hemoglobin: 14.9 g/dL (ref 12.0–15.0)
MCH: 29.2 pg (ref 26.0–34.0)
MCHC: 32.4 g/dL (ref 30.0–36.0)
MCV: 90 fL (ref 80.0–100.0)
Platelets: 282 10*3/uL (ref 150–400)
RBC: 5.11 MIL/uL (ref 3.87–5.11)
RDW: 12.9 % (ref 11.5–15.5)
WBC: 7 10*3/uL (ref 4.0–10.5)
nRBC: 0 % (ref 0.0–0.2)

## 2020-06-11 LAB — COMPREHENSIVE METABOLIC PANEL
ALT: 79 U/L — ABNORMAL HIGH (ref 0–44)
AST: 70 U/L — ABNORMAL HIGH (ref 15–41)
Albumin: 4.1 g/dL (ref 3.5–5.0)
Alkaline Phosphatase: 77 U/L (ref 38–126)
Anion gap: 11 (ref 5–15)
BUN: 14 mg/dL (ref 6–20)
CO2: 25 mmol/L (ref 22–32)
Calcium: 9.2 mg/dL (ref 8.9–10.3)
Chloride: 103 mmol/L (ref 98–111)
Creatinine, Ser: 0.6 mg/dL (ref 0.44–1.00)
GFR calc Af Amer: >60 mL/min (ref >60)
GFR calc non Af Amer: >60 mL/min (ref >60)
Glucose, Bld: 99 mg/dL (ref 70–99)
Potassium: 4.1 mmol/L (ref 3.5–5.1)
Sodium: 139 mmol/L (ref 135–145)
Total Bilirubin: 0.6 mg/dL (ref 0.3–1.2)
Total Protein: 7.7 g/dL (ref 6.5–8.1)

## 2020-06-11 LAB — D-DIMER, QUANTITATIVE: D-Dimer, Quant: 0.53 ug/mL-FEU — ABNORMAL HIGH (ref 0.00–0.50)

## 2020-06-11 LAB — FIBRINOGEN: Fibrinogen: 550 mg/dL — ABNORMAL HIGH (ref 210–475)

## 2020-06-11 LAB — HCG, QUANTITATIVE, PREGNANCY: hCG, Beta Chain, Quant, S: 6 m[IU]/mL — ABNORMAL HIGH (ref <5)

## 2020-06-11 LAB — TRIGLYCERIDES: Triglycerides: 133 mg/dL (ref <150)

## 2020-06-11 LAB — PROCALCITONIN: Procalcitonin: <0.1 ng/mL

## 2020-06-11 LAB — FERRITIN: Ferritin: 748 ng/mL — ABNORMAL HIGH (ref 11–307)

## 2020-06-11 LAB — LACTATE DEHYDROGENASE: LDH: 297 U/L — ABNORMAL HIGH (ref 98–192)

## 2020-06-11 LAB — C-REACTIVE PROTEIN: CRP: 2.5 mg/dL — ABNORMAL HIGH (ref <1.0)

## 2020-06-11 LAB — CREATININE, SERUM
Creatinine, Ser: 0.67 mg/dL (ref 0.44–1.00)
GFR calc Af Amer: >60 mL/min (ref >60)
GFR calc non Af Amer: >60 mL/min (ref >60)

## 2020-06-11 MED ORDER — METHYLPREDNISOLONE SODIUM SUCC 500 MG IJ SOLR: 2.0000 mg/kg | Freq: Two times a day (BID) | INTRAMUSCULAR | Status: DC

## 2020-06-11 MED ORDER — GUAIFENESIN-DM 100-10 MG/5ML PO SYRP
10.0000 mL | ORAL_SOLUTION | ORAL | Status: DC | PRN
  Administered 2020-06-11 – 2020-06-15 (×6): 10 mL via ORAL
  Filled 2020-06-11 (×6): qty 10

## 2020-06-11 MED ORDER — ONDANSETRON HCL 4 MG PO TABS: 4.0000 mg | ORAL_TABLET | Freq: Four times a day (QID) | ORAL | Status: DC | PRN

## 2020-06-11 MED ORDER — ACETAMINOPHEN 325 MG PO TABS
650.0000 mg | ORAL_TABLET | Freq: Four times a day (QID) | ORAL | Status: DC | PRN
  Administered 2020-06-11 – 2020-06-14 (×2): 650 mg via ORAL
  Filled 2020-06-11 (×2): qty 2

## 2020-06-11 MED ORDER — ONDANSETRON HCL 4 MG/2ML IJ SOLN: 4.0000 mg | Freq: Four times a day (QID) | INTRAMUSCULAR | Status: DC | PRN

## 2020-06-11 MED ORDER — SODIUM CHLORIDE 0.9 % IV SOLN: 200.0000 mg | Freq: Once | INTRAVENOUS | Status: DC

## 2020-06-11 MED ORDER — INSULIN ASPART 100 UNIT/ML ~~LOC~~ SOLN
0.0000 [IU] | Freq: Three times a day (TID) | SUBCUTANEOUS | Status: DC
  Administered 2020-06-13: 3 [IU] via SUBCUTANEOUS
  Administered 2020-06-13 – 2020-06-14 (×3): 2 [IU] via SUBCUTANEOUS
  Administered 2020-06-14: 3 [IU] via SUBCUTANEOUS
  Filled 2020-06-11: qty 0.09

## 2020-06-11 MED ORDER — ENOXAPARIN SODIUM 40 MG/0.4ML ~~LOC~~ SOLN
40.0000 mg | SUBCUTANEOUS | Status: DC
  Administered 2020-06-11 – 2020-06-15 (×5): 40 mg via SUBCUTANEOUS
  Filled 2020-06-11 (×5): qty 0.4

## 2020-06-11 MED ORDER — SODIUM CHLORIDE 0.9 % IV SOLN: 100.0000 mg | Freq: Every day | INTRAVENOUS | Status: DC

## 2020-06-11 MED ORDER — BARICITINIB 2 MG PO TABS
4.0000 mg | ORAL_TABLET | Freq: Every day | ORAL | Status: DC
  Administered 2020-06-11 – 2020-06-16 (×6): 4 mg via ORAL
  Filled 2020-06-11 (×7): qty 2

## 2020-06-11 MED ORDER — ALBUTEROL SULFATE HFA 108 (90 BASE) MCG/ACT IN AERS
2.0000 | INHALATION_SPRAY | Freq: Four times a day (QID) | RESPIRATORY_TRACT | Status: DC
  Administered 2020-06-11 – 2020-06-15 (×17): 2 via RESPIRATORY_TRACT
  Filled 2020-06-11: qty 6.7

## 2020-06-11 MED ORDER — SODIUM CHLORIDE 0.9 % IV SOLN
200.0000 mg | Freq: Once | INTRAVENOUS | Status: AC
  Administered 2020-06-11: 200 mg via INTRAVENOUS
  Filled 2020-06-11: qty 200

## 2020-06-11 MED ORDER — METHYLPREDNISOLONE SODIUM SUCC 125 MG IJ SOLR
87.5000 mg | Freq: Two times a day (BID) | INTRAMUSCULAR | Status: DC
  Administered 2020-06-11 – 2020-06-15 (×8): 87.5 mg via INTRAVENOUS
  Filled 2020-06-11 (×8): qty 2

## 2020-06-11 MED ORDER — RAMIPRIL 5 MG PO CAPS
5.0000 mg | ORAL_CAPSULE | Freq: Every day | ORAL | Status: DC
  Administered 2020-06-12 – 2020-06-16 (×5): 5 mg via ORAL
  Filled 2020-06-11 (×5): qty 1

## 2020-06-11 MED ORDER — SODIUM CHLORIDE 0.9 % IV SOLN
100.0000 mg | Freq: Every day | INTRAVENOUS | Status: AC
  Administered 2020-06-12 – 2020-06-15 (×4): 100 mg via INTRAVENOUS
  Filled 2020-06-11 (×5): qty 20

## 2020-06-11 NOTE — ED Triage Notes (Signed)
Pt BIB EMS from home. Pt states she is COVID+ since 8/23. Pt c/o cough when taking a deep breath. EMS reports clear lung sounds. EMS reports pt 84% RA. Pt 93% 3L [place]. Pt currently 4L [place] in room at 93%. Hx of HTN.   CBG 101 149/108

## 2020-06-11 NOTE — H&P (Addendum)
History and Physical        Hospital Admission Note Date: 06/11/2020  Patient name: Amber Baker Medical record number: [id number] Date of birth: 01/29/1966 Age: 54 y.o. Gender: female  PCP: Emi BelfastGessner, Deborah B, FNP  Patient coming from: home Lives with: Home At baseline, ambulates: Independently  Chief Complaint    Chief Complaint  Patient presents with  . Covid Exposure  . Shortness of Breath  . Cough      HPI:   This is a 54 year old female with past medical history of hypertension who presented to the ED with worsening shortness of breath and hypoxemia after recently being diagnosed with COVID-19 at a [facility] urgent care on 8/30.  Reportedly her symptoms began on 8/23 and had worsened prompting her to initially seek care at an urgent care where she was prescribed prednisone and doxycycline.  She states she only took about 2 days worth of these medications so far.  Reports that her cough has worsened and resulted in significant shortness of breath this a.m. prompting her to call 911.  EMS found patient to be high toxemic at 84% on room air and she was titrated up to 4 L/min nasal cannula and was not given any additional treatment in route.  States that she has had fever up to 101.33F at home, some change in taste, diarrhea, cough is intermittently productive.  Was at a family funeral about 2 weeks ago which seems to have been a super spreader event for the family.  She has not been vaccinated against COVID-19  ED Course: Vitals: T 98.4, P 93, respirations up to 40s at bedside, BP 132/86, SPO2 93% on 4 L/min O2.  Notable labs: AST 70, ALT 79, LDH 297, ferritin 748, CRP 2.5, lactic acid 1.3, WBC 7.4, Hb 16.2, D-dimer 0.53, fibrinogen 550.  Beta hCG 6 (negative is <5).  CXR with peripheral predominant airspace opacities in the L> R lung bases concerning for pneumonia given clinical history.  She was started on remdesivir.  Vitals:   06/11/20 1230 06/11/20 1300  BP: 132/87 132/86  Pulse: 93 85  Resp: 20 (!) 37  Temp: 98.4 F (36.9 C)   SpO2: 93% 96%     Review of Systems:  Review of Systems  Constitutional: Positive for chills, fever and malaise/fatigue.  Respiratory: Positive for cough, sputum production and shortness of breath. Negative for wheezing.   Cardiovascular: Negative for chest pain and leg swelling.  Gastrointestinal: Positive for diarrhea. Negative for nausea and vomiting.  Genitourinary: Negative for dysuria.  Musculoskeletal: Negative for myalgias.  Skin: Negative for rash.  Neurological: Positive for headaches.  Endo/Heme/Allergies: Does not bruise/bleed easily.  All other systems reviewed and are negative.   Medical/Social/Family History   Past Medical History: Past Medical History:  Diagnosis Date  . Abdominal pain, generalized   . Abnormal weight gain   . Acute upper respiratory infections of unspecified site   . Allergy   . Arthritis    Feet - diagnosed by Podiatrist  . Essential hypertension, benign   . Gastroenteritis   . Migraine, unspecified, without mention of intractable migraine without mention of status migrainosus   . Routine general medical examination at a health care facility  Past Surgical History:  Procedure Laterality Date  . CHOLECYSTECTOMY    . OVARY SURGERY Left     Medications: Prior to Admission medications   Medication Sig Start Date End Date Taking? Authorizing Provider  albuterol (VENTOLIN HFA) 108 (90 Base) MCG/ACT inhaler Inhale 2 puffs into the lungs every 4 (four) hours. 06/08/20  Yes [provider]  Aspirin-Salicylamide-Caffeine (BC HEADACHE POWDER PO) Take 1 application by mouth daily as needed (Headache).    Yes [provider]  benzonatate (TESSALON) 200 MG capsule Take 200 mg by mouth in the morning, at noon, and at bedtime. 06/08/20  Yes [provider]  Boswellia  Serrata (BOSWELLIA PO) Take 1 tablet by mouth daily.    Yes [provider]  Cholecalciferol (VITAMIN D-3) 125 MCG (5000 UT) TABS Take 5,000 Units by mouth daily.    Yes [provider]  DHEA 10 MG TABS Take 10 mg by mouth daily.    Yes [provider]  Digestive Enzymes (SUPER ENZYMES PO) Take 1 tablet by mouth daily.    Yes [provider]  doxycycline (VIBRAMYCIN) 100 MG capsule Take 100 mg by mouth 2 (two) times daily. 06/08/20  Yes [provider]  Ferrous Sulfate (IRON PO) Take 325 mg by mouth daily.    Yes [provider]  fexofenadine (ALLEGRA) 180 MG tablet Take 180 mg by mouth daily as needed for allergies.    Yes [provider]  ibuprofen (ADVIL,MOTRIN) 200 MG tablet Take 200 mg by mouth every 6 (six) hours as needed for headache or moderate pain.    Yes [provider]  meloxicam (MOBIC) 15 MG tablet Take 1 tablet (15 mg total) by mouth daily. 02/12/20  Yes Hyatt, Max T, DPM  Multiple Vitamin (MULTIVITAMIN) tablet Take 1 tablet by mouth daily.   Yes [provider]  naproxen sodium (ALEVE) 220 MG tablet Take 220 mg by mouth daily as needed (headache).    Yes [provider]  predniSONE (DELTASONE) 20 MG tablet Take 20 mg by mouth at bedtime. 06/08/20  Yes [provider]  Probiotic Product (PROBIOTIC DAILY PO) Take 1 tablet by mouth daily.    Yes [provider]  ramipril (ALTACE) 5 MG capsule Take 1 capsule (5 mg total) by mouth daily. 05/18/20  Yes Emi Belfast, FNP  SACCHAROMYCES BOULARDII PO Take 1 tablet by mouth daily.    Yes [provider]  SUMAtriptan (IMITREX) 50 MG tablet TAKE 1 TABLET BY MOUTH AS NEEDED FOR MIGRAINE. MAY REPEAT IN 2 HOURS IF NEEDED. MAX OF 2 TABLETS IN 24 HOURS. Patient taking differently: Take 50 mg by mouth every 2 (two) hours as needed for migraine.  12/20/19  Yes Emi Belfast, FNP  Turmeric (QC TUMERIC COMPLEX PO) Take 1 tablet by  mouth daily.    Yes [provider]  fluticasone (FLONASE) 50 MCG/ACT nasal spray Place 1 spray into both nostrils. 06/08/20   [provider]    Allergies:   Allergies  Allergen Reactions  . Penicillins Other (See Comments)    REACTION: unknown Childhood    Social History:  reports that she has never smoked. She has never used smokeless tobacco. She reports that she does not drink alcohol and does not use drugs.  Family History: Family History  Problem Relation Age of Onset  . Colon cancer Neg Hx   . Esophageal cancer Neg Hx   . Rectal cancer Neg Hx   . Stomach cancer Neg Hx  Objective   Physical Exam: Blood pressure 132/86, pulse 85, temperature 98.4 F (36.9 C), resp. rate (!) 37, last menstrual period 03/24/2014, SpO2 96 %.  Physical Exam Vitals and nursing note reviewed.  Constitutional:      Appearance: She is ill-appearing.  HENT:     Head: Normocephalic.  Eyes:     Pupils: Pupils are equal, round, and reactive to light.  Cardiovascular:     Rate and Rhythm: Regular rhythm. Tachycardia present.  Pulmonary:     Effort: Tachypnea present.     Comments: Conversational dyspnea Musculoskeletal:     Right lower leg: No edema.     Left lower leg: No edema.  Skin:    General: Skin is warm.  Neurological:     General: No focal deficit present.     Mental Status: She is alert.  Psychiatric:        Mood and Affect: Mood normal.        Behavior: Behavior normal.     LABS on Admission: I have personally reviewed all the labs and imaging below    Basic Metabolic Panel: Recent Labs  Lab 06/11/20 1230  NA 139  K 4.1  CL 103  CO2 25  GLUCOSE 99  BUN 14  CREATININE 0.60  CALCIUM 9.2   Liver Function Tests: Recent Labs  Lab 06/11/20 1230  AST 70*  ALT 79*  ALKPHOS 77  BILITOT 0.6  PROT 7.7  ALBUMIN 4.1   No results for input(s): LIPASE, AMYLASE in the last 168 hours. No results for input(s): AMMONIA in the last 168  hours. CBC: Recent Labs  Lab 06/11/20 1230  WBC 7.4  NEUTROABS 6.2  HGB 16.2*  HCT 48.8*  MCV 88.4  PLT 261   Cardiac Enzymes: No results for input(s): CKTOTAL, CKMB, CKMBINDEX, TROPONINI in the last 168 hours. BNP: Invalid input(s): POCBNP CBG: No results for input(s): GLUCAP in the last 168 hours.  Radiological Exams on Admission:  DG Chest Port 1 View  Result Date: 06/11/2020 CLINICAL DATA:  COVID positive with cough. EXAM: PORTABLE CHEST 1 VIEW COMPARISON:  None. FINDINGS: Areas of peripheral predominant airspace opacities within the left greater than right lung bases. There are some areas of linear opacity within bilateral lung bases. No pleural effusions. No pneumothorax. Cardiac silhouette is within normal limits. No acute osseous abnormality. IMPRESSION: Areas of peripheral predominant airspace opacities within the left greater than right lung bases, concerning for pneumonia given the clinical history. Linear scarring/atelectasis is also suspected. Electronically Signed   By: Feliberto Harts MD   On: 06/11/2020 12:42      EKG: Independently reviewed.    A & P   Principal Problem:   Acute hypoxemic respiratory failure due to COVID-19 [facility]) Active Problems:   Essential hypertension, benign   Abnormal transaminases   1. Acute hypoxic respiratory failure secondary to COVID-19 a. Afebrile and hemodynamically stable requiring 4 L/min nasal cannula b. Tested positive on 8/30 (results in care everywhere) c. Elevated transaminases (AST/ALT 70/79) likely secondary to viral infection d. Inflammatory markers: LDH 297, ferritin 748, CRP 2.5, D-dimer 0.53 - continue to trend e. start on remdesivir, Solu-Medrol 1 mg/kg, baricitinib, scheduled inhalers, antitussives, incentive spirometry f. Currently afebrile, no leukocytosis, negative procalcitonin-hold antibiotics g. No history of diabetes but given high doses of steroids will start on a sliding scale h. Prone as  tolerated  2. Hypertension, stable a. Continue home ACE inhibitor  3. Minimally elevated beta hCG, likely false positive a. Resulted  at 6, normal <5 b. Check urinary pregnancy test   DVT prophylaxis: Lovenox   Code Status: Not on file  Diet: Heart healthy/carb controlled (high-dose steroids) Family Communication: Admission, patients condition and plan of care including tests being ordered have been discussed with the patient who indicates understanding and agrees with the plan and Code Status. Patient's husband was updated  Disposition Plan: The appropriate patient status for this patient is INPATIENT. Inpatient status is judged to be reasonable and necessary in order to provide the required intensity of service to ensure the patient's safety. The patient's presenting symptoms, physical exam findings, and initial radiographic and laboratory data in the context of their chronic comorbidities is felt to place them at high risk for further clinical deterioration. Furthermore, it is not anticipated that the patient will be medically stable for discharge from the hospital within 2 midnights of admission. The following factors support the patient status of inpatient.   " The patient's presenting symptoms include hypoxia, shortness of breath. " The worrisome physical exam findings include tachypnea, tachycardia. " The initial radiographic and laboratory data are worrisome because of hypoxia, inflammatory markers elevated. " The chronic co-morbidities include hypertension.   * I certify that at the point of admission it is my clinical judgment that the patient will require inpatient hospital care spanning beyond 2 midnights from the point of admission due to high intensity of service, high risk for further deterioration and high frequency of surveillance required.*   Status is: Inpatient  Remains inpatient appropriate because:IV treatments appropriate due to intensity of illness or inability to  take PO and Inpatient level of care appropriate due to severity of illness   Dispo: The patient is from: Home              Anticipated d/c is to: Home              Anticipated d/c date is: > 3 days              Patient currently is not medically stable to d/c.     Consultants  . none  Procedures  . none  Time Spent on Admission: 65 minutes    Jae Dire, DO [facility] Pager [phone] 06/11/2020, 2:24 PM

## 2020-06-11 NOTE — ED Provider Notes (Signed)
Emergency Department Provider Note   I have reviewed the triage vital signs and the nursing notes.   HISTORY  Chief Complaint COVID+, Shortness of Breath, and Cough   HPI Amber Baker is a 54 y.o. female with past medical history reviewed below presents to the emergency department with COVID-19 infection with worsening shortness of breath and hypoxemia.  Patient's been symptomatic since 8/23 where she tested positive at a [facility] urgent care.  The result is available for review in Care Everywhere.  Patient was discharged home on doxycycline, prednisone, and albuterol inhaler with no relief in symptoms.  She was called to get monoclonal antibodies but the wait for this was very [place] and she ultimately went home without receiving that.  She does not wear home oxygen.  She is not experiencing chest pain.  She is having some mild diarrhea without abdominal discomfort.  Some nausea with occasional vomiting noted as well.  In speaking with EMS at the bedside they found the patient hypoxemic at 84% on room air.  She was titrated up to 4 L nasal cannula is currently at 93%.  No additional treatment given in route.  Past Medical History:  Diagnosis Date  . Abdominal pain, generalized   . Abnormal weight gain   . Acute upper respiratory infections of unspecified site   . Allergy   . Arthritis    Feet - diagnosed by Podiatrist  . Essential hypertension, benign   . Gastroenteritis   . Migraine, unspecified, without mention of intractable migraine without mention of status migrainosus   . Routine general medical examination at a health care facility     Patient Active Problem List   Diagnosis Date Noted  . Acute hypoxemic respiratory failure due to COVID-19 (HCC) 06/11/2020  . Fatigue 08/29/2016  . Fecal incontinence 07/25/2016  . 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 Cellulitis and abscess 07/04/2016  . Encounter for routine gynecological examination 05/23/2016  . Allergic rhinitis  02/14/2013  . Migraine headache 11/27/2007  . ESSENTIAL HYPERTENSION, BENIGN 08/10/2007    Past Surgical History:  Procedure Laterality Date  . CHOLECYSTECTOMY    . OVARY SURGERY Left     Allergies Penicillins  Family History  Problem Relation Age of Onset  . Colon cancer Neg Hx   . Esophageal cancer Neg Hx   . Rectal cancer Neg Hx   . Stomach cancer Neg Hx     Social History Social History   Tobacco Use  . Smoking status: Never Smoker  . Smokeless tobacco: Never Used  Vaping Use  . Vaping Use: Never used  Substance Use Topics  . Alcohol use: No    Alcohol/week: 0.0 standard drinks  . Drug use: No    Review of Systems  Constitutional: Positive fever/chills Eyes: No visual changes. ENT: No sore throat. Cardiovascular: Denies chest pain. Respiratory: Positive shortness of breath and cough.  Gastrointestinal: No abdominal pain.  Positive nausea, vomiting, and diarrhea.  No constipation. Genitourinary: Negative for dysuria. Musculoskeletal: Negative for back pain. Skin: Negative for rash. Neurological: Negative for headaches, focal weakness or numbness.  10-point ROS otherwise negative.  ____________________________________________   PHYSICAL EXAM:  VITAL SIGNS: Vitals:   06/11/20 1230 06/11/20 1300  BP: 132/87 132/86  Pulse: 93 85  Resp: 20 (!) 37  Temp: 98.4 F (36.9 C)   SpO2: 93% 96%     Constitutional: Alert and oriented. Well appearing and in no acute distress. Eyes: Conjunctivae are normal.  Head: Atraumatic. Nose: No congestion/rhinnorhea.  Mouth/Throat: Mucous membranes are moist.  Neck: No stridor.  Cardiovascular: Normal rate, regular rhythm. Good peripheral circulation. Grossly normal heart sounds.   Respiratory: Normal respiratory effort.  No retractions. Lungs CTAB. Gastrointestinal: Soft and nontender. No distention.  Musculoskeletal: No lower extremity tenderness nor edema. No gross deformities of extremities. Neurologic:   Normal speech and language. No gross focal neurologic deficits are appreciated.  Skin:  Skin is warm, dry and intact. No rash noted.   ____________________________________________   LABS (all labs ordered are listed, but only abnormal results are displayed)  Labs Reviewed  CBC WITH DIFFERENTIAL/PLATELET - Abnormal; Notable for the following components:      Result Value   RBC 5.52 (*)    Hemoglobin 16.2 (*)    HCT 48.8 (*)    Lymphs Abs 0.6 (*)    All other components within normal limits  COMPREHENSIVE METABOLIC PANEL - Abnormal; Notable for the following components:   AST 70 (*)    ALT 79 (*)    All other components within normal limits  D-DIMER, QUANTITATIVE (NOT AT [facility]) - Abnormal; Notable for the following components:   D-Dimer, Quant 0.53 (*)    All other components within normal limits  LACTATE DEHYDROGENASE - Abnormal; Notable for the following components:   LDH 297 (*)    All other components within normal limits  FIBRINOGEN - Abnormal; Notable for the following components:   Fibrinogen 550 (*)    All other components within normal limits  CULTURE, BLOOD (ROUTINE X 2)  CULTURE, BLOOD (ROUTINE X 2)  LACTIC ACID, PLASMA  TRIGLYCERIDES  LACTIC ACID, PLASMA  PROCALCITONIN  FERRITIN  C-REACTIVE PROTEIN  HCG, QUANTITATIVE, PREGNANCY   ____________________________________________  EKG   EKG Interpretation  Date/Time:  Thursday June 11 2020 12:41:18 EDT Ventricular Rate:  96 PR Interval:    QRS Duration: 89 QT Interval:  339 QTC Calculation: 429 R Axis:   -23 Text Interpretation: Sinus rhythm Borderline left axis deviation Borderline low voltage, extremity leads No STEMI Confirmed by Alona Bene [phone]) on 06/11/2020 12:54:13 PM       ____________________________________________  RADIOLOGY  DG Chest Port 1 View  Result Date: 06/11/2020 CLINICAL DATA:  COVID positive with cough. EXAM: PORTABLE CHEST 1 VIEW COMPARISON:  None. FINDINGS: Areas of  peripheral predominant airspace opacities within the left greater than right lung bases. There are some areas of linear opacity within bilateral lung bases. No pleural effusions. No pneumothorax. Cardiac silhouette is within normal limits. No acute osseous abnormality. IMPRESSION: Areas of peripheral predominant airspace opacities within the left greater than right lung bases, concerning for pneumonia given the clinical history. Linear scarring/atelectasis is also suspected. Electronically Signed   By: Feliberto Harts MD   On: 06/11/2020 12:42    ____________________________________________   PROCEDURES  Procedure(s) performed:   Procedures  CRITICAL CARE Performed by: Maia Plan Total critical care time: 35 minutes Critical care time was exclusive of separately billable procedures and treating other patients. Critical care was necessary to treat or prevent imminent or life-threatening deterioration. Critical care was time spent personally by me on the following activities: development of treatment plan with patient and/or surrogate as well as nursing, discussions with consultants, evaluation of patient's response to treatment, examination of patient, obtaining history from patient or surrogate, ordering and performing treatments and interventions, ordering and review of laboratory studies, ordering and review of radiographic studies, pulse oximetry and re-evaluation of patient's condition.  Alona Bene, MD Emergency Medicine  ____________________________________________  INITIAL IMPRESSION / ASSESSMENT AND PLAN / ED COURSE  Pertinent labs & imaging results that were available during my care of the patient were reviewed by me and considered in my medical decision making (see chart for details).   Patient presents emergency department COVID-19 infection.  The result is available in care everywhere under the [facility] chart.  Patient is hypoxemic now but in no acute respiratory distress.   Tolerating 4 L nasal cannula well.  Plan for preadmit labs.  She has been on prednisone since her urgent care visit.  Chest x-ray is pending.   Labs and chest x-ray reviewed.  Patient has infiltrate on her chest x-ray.  I have added order for remdesivir.  Plan for admit.   Discussed patient's case with [facility] to request admission. Patient and family (if present) updated with plan. Care transferred to [facility] service.  I reviewed all nursing notes, vitals, pertinent old records, EKGs, labs, imaging (as available).  ____________________________________________  FINAL CLINICAL IMPRESSION(S) / ED DIAGNOSES  Final diagnoses:  Acute respiratory failure with hypoxia (HCC)  COVID-19    Note:  This document was prepared using Dragon voice recognition software and may include unintentional dictation errors.  Alona Bene, MD, [facility] Emergency Medicine    [facility] Teegarden, Arlyss Repress, MD 06/11/20 [phone]

## 2020-06-12 LAB — ABO/RH: ABO/RH(D): B NEG

## 2020-06-12 LAB — COMPREHENSIVE METABOLIC PANEL
ALT: 89 U/L — ABNORMAL HIGH (ref 0–44)
AST: 73 U/L — ABNORMAL HIGH (ref 15–41)
Albumin: 3.6 g/dL (ref 3.5–5.0)
Alkaline Phosphatase: 67 U/L (ref 38–126)
Anion gap: 10 (ref 5–15)
BUN: 19 mg/dL (ref 6–20)
CO2: 25 mmol/L (ref 22–32)
Calcium: 8.7 mg/dL — ABNORMAL LOW (ref 8.9–10.3)
Chloride: 103 mmol/L (ref 98–111)
Creatinine, Ser: 0.7 mg/dL (ref 0.44–1.00)
GFR calc Af Amer: >60 mL/min (ref >60)
GFR calc non Af Amer: >60 mL/min (ref >60)
Glucose, Bld: 179 mg/dL — ABNORMAL HIGH (ref 70–99)
Potassium: 4.4 mmol/L (ref 3.5–5.1)
Sodium: 138 mmol/L (ref 135–145)
Total Bilirubin: 0.6 mg/dL (ref 0.3–1.2)
Total Protein: 6.7 g/dL (ref 6.5–8.1)

## 2020-06-12 LAB — CBC WITH DIFFERENTIAL/PLATELET
Abs Immature Granulocytes: 0.01 10*3/uL (ref 0.00–0.07)
Basophils Absolute: 0 10*3/uL (ref 0.0–0.1)
Basophils Relative: 0 %
Eosinophils Absolute: 0 10*3/uL (ref 0.0–0.5)
Eosinophils Relative: 0 %
HCT: 44.2 % (ref 36.0–46.0)
Hemoglobin: 14.4 g/dL (ref 12.0–15.0)
Immature Granulocytes: 0 %
Lymphocytes Relative: 13 %
Lymphs Abs: 0.5 10*3/uL — ABNORMAL LOW (ref 0.7–4.0)
MCH: 29.2 pg (ref 26.0–34.0)
MCHC: 32.6 g/dL (ref 30.0–36.0)
MCV: 89.7 fL (ref 80.0–100.0)
Monocytes Absolute: 0.2 10*3/uL (ref 0.1–1.0)
Monocytes Relative: 5 %
Neutro Abs: 3.5 10*3/uL (ref 1.7–7.7)
Neutrophils Relative %: 82 %
Platelets: 221 10*3/uL (ref 150–400)
RBC: 4.93 MIL/uL (ref 3.87–5.11)
RDW: 12.9 % (ref 11.5–15.5)
WBC: 4.2 10*3/uL (ref 4.0–10.5)
nRBC: 0 % (ref 0.0–0.2)

## 2020-06-12 LAB — GLUCOSE, CAPILLARY
Glucose-Capillary: 112 mg/dL — ABNORMAL HIGH (ref 70–99)
Glucose-Capillary: 170 mg/dL — ABNORMAL HIGH (ref 70–99)
Glucose-Capillary: 156 mg/dL — ABNORMAL HIGH (ref 70–99)
Glucose-Capillary: 150 mg/dL — ABNORMAL HIGH (ref 70–99)
Glucose-Capillary: 157 mg/dL — ABNORMAL HIGH (ref 70–99)

## 2020-06-12 LAB — C-REACTIVE PROTEIN: CRP: 3.1 mg/dL — ABNORMAL HIGH (ref <1.0)

## 2020-06-12 LAB — MAGNESIUM: Magnesium: 2.2 mg/dL (ref 1.7–2.4)

## 2020-06-12 LAB — HIV ANTIBODY (ROUTINE TESTING W REFLEX): HIV Screen 4th Generation wRfx: NONREACTIVE

## 2020-06-12 LAB — FERRITIN: Ferritin: 725 ng/mL — ABNORMAL HIGH (ref 11–307)

## 2020-06-12 LAB — D-DIMER, QUANTITATIVE: D-Dimer, Quant: 0.44 ug/mL-FEU (ref 0.00–0.50)

## 2020-06-12 LAB — LACTIC ACID, PLASMA: Lactic Acid, Venous: 1.2 mmol/L (ref 0.5–1.9)

## 2020-06-12 NOTE — Progress Notes (Signed)
PROGRESS NOTE    Amber Baker  PPJ:[id number] DOB: May 24, 1966 DOA: 06/11/2020 PCP: Emi Belfast, FNP    Brief Narrative:  Amber Baker is a 54 year old female with past medical history of hypertension who presented to the ED with worsening shortness of breath and hypoxemia after recently being diagnosed with COVID-19 at a [facility] urgent care on 8/30.  Reportedly her symptoms began on 8/23 and had worsened prompting her to initially seek care at an urgent care where she was prescribed prednisone and doxycycline.  She states she only took about 2 days worth of these medications so far.  Reports that her cough has worsened and resulted in significant shortness of breath this a.m. prompting her to call 911.  EMS found patient to be high toxemic at 84% on room air and she was titrated up to 4 L/min nasal cannula and was not given any additional treatment in route.  States that she has had fever up to 101.10F at home, some change in taste, diarrhea, cough is intermittently productive.  Was at a family funeral about 2 weeks ago which seems to have been a super spreader event for the family.  She has not been vaccinated against COVID-19  ED Course: Vitals: T 98.4, P 93, respirations up to 40s at bedside, BP 132/86, SPO2 93% on 4 L/min O2.  Notable labs: AST 70, ALT 79, LDH 297, ferritin 748, CRP 2.5, lactic acid 1.3, WBC 7.4, Hb 16.2, D-dimer 0.53, fibrinogen 550.  Beta hCG 6 (negative is <5).  CXR with peripheral predominant airspace opacities in the L> R lung bases concerning for pneumonia given clinical history. She was started on remdesivir.   Assessment & Plan:   Principal Problem:   Acute hypoxemic respiratory failure due to COVID-19 [facility]) Active Problems:   Essential hypertension, benign   Abnormal transaminases   Acute hypoxic respiratory failure secondary to acute Covid-19 viral pneumonia during the ongoing 2020 Covid 19 Pandemic - POA Patient presenting to ED after progressive shortness  of breath.  Recent Covid-19 test positive on 06/08/2020.  Initial presentation she was found to be hypoxic with SPO2 84% on room air.  Fever up to 101.8.  Patient is unvaccinated. --COVID test: + PCR 8/30 from [facility] --CRP 2.5>3.1 --ddimer 0.53>0.44 --Remdesivir, plan 5-day course (Day #2/5) --Baricitinib 4mg  PO daily (Day #2/14) --Continue Solumedrol 87.5mg  IV q12h --prone for 2-3hrs every 12hrs if able --Continue supplemental oxygen, titrate to maintain SPO2 greater than 92%, currently on 4 L nasal cannula with SPO2 97% --Continue supportive care with albuterol MDI prn, vitamin C, zinc, Tylenol, antitussives (benzonatate/ Mucinex/Tussionex) --Follow CBC, CMP, D-dimer, ferritin, and CRP daily --Continue airborne/contact isolation precautions for 3 weeks from the day of diagnosis  The treatment plan and use of medications and known side effects were discussed with patient/family. Some of the medications used are based on case reports/anecdotal data.  All other medications being used in the management of COVID-19 based on limited study data.  Complete risks and long-term side effects are unknown, however in the best clinical judgment they seem to be of some benefit.  Patient wanted to proceed with treatment options provided.  Essential hypertension BP 136/90, well controlled. --ramipril 5 mg p.o. daily  Minimally elevated beta hCG Resulted at 6, normal <5; Etiology likely false positive. --Urine pregnancy test pending   DVT prophylaxis: Lovenox Code Status: Full code Family Communication: Updated patient extensively at bedside, attempted to contact patient's spouse via telephone unsuccessful  Disposition Plan:  Status  is: Inpatient  Remains inpatient appropriate because:Ongoing diagnostic testing needed not appropriate for outpatient work up, Unsafe d/c plan, IV treatments appropriate due to intensity of illness or inability to take PO and Inpatient level of care appropriate due to  severity of illness   Dispo: The patient is from: Home              Anticipated d/c is to: Home              Anticipated d/c date is: 3 days              Patient currently is not medically stable to d/c.   Consultants:   none  Procedures:   none  Antimicrobials:   Baricitinib 9/2>>  Remdesivir 9/2>>   Subjective: Patient seen and examined bedside, resting comfortably.  Continues with mild dyspnea at rest worse with exertion.  No other complaints or concerns at this time.  Continues on 4 L nasal cannula.  Denies headache, no fever/chills/night sweats, no nausea/vomiting/diarrhea, no chest pain, palpitations, no abdominal pain.  No acute events overnight per nurse staff.  Objective: Vitals:   06/11/20 2100 06/11/20 2129 06/12/20 0130 06/12/20 0922  BP: (!) 101/91 (!) 128/91 136/90 134/90  Pulse: 74 90 78 82  Resp: (!) 25 (!) 22 20 16   Temp:  97.7 F (36.5 C) 98.2 F (36.8 C) 98.1 F (36.7 C)  TempSrc:  Oral Oral Oral  SpO2: 95% 95% 97% 93%    Intake/Output Summary (Last 24 hours) at 06/12/2020 1313 Last data filed at 06/12/2020 1305 Gross per 24 hour  Intake 958 ml  Output --  Net 958 ml   There were no vitals filed for this visit.  Examination:  General exam: Appears calm and comfortable  Respiratory system: Breath sounds decreased bilateral bases, normal respiratory effort, on 4 L nasal cannula with SPO2 97% at rest Cardiovascular system: S1 & S2 heard, RRR. No JVD, murmurs, rubs, gallops or clicks. No pedal edema. Gastrointestinal system: Abdomen is nondistended, soft and nontender. No organomegaly or masses felt. Normal bowel sounds heard. Central nervous system: Alert and oriented. No focal neurological deficits. Extremities: Symmetric 5 x 5 power. Skin: No rashes, lesions or ulcers Psychiatry: Judgement and insight appear normal. Mood & affect appropriate.     Data Reviewed: I have personally reviewed following labs and imaging studies  CBC: Recent  Labs  Lab 06/11/20 1230 06/11/20 2157 06/12/20 0357  WBC 7.4 7.0 4.2  NEUTROABS 6.2  --  3.5  HGB 16.2* 14.9 14.4  HCT 48.8* 46.0 44.2  MCV 88.4 90.0 89.7  PLT 261 282 221   Basic Metabolic Panel: Recent Labs  Lab 06/11/20 1230 06/11/20 2157 06/12/20 0357  NA 139  --  138  K 4.1  --  4.4  CL 103  --  103  CO2 25  --  25  GLUCOSE 99  --  179*  BUN 14  --  19  CREATININE 0.60 0.67 0.70  CALCIUM 9.2  --  8.7*  MG  --   --  2.2   GFR: CrCl cannot be calculated (Unknown ideal weight.). Liver Function Tests: Recent Labs  Lab 06/11/20 1230 06/12/20 0357  AST 70* 73*  ALT 79* 89*  ALKPHOS 77 67  BILITOT 0.6 0.6  PROT 7.7 6.7  ALBUMIN 4.1 3.6   No results for input(s): LIPASE, AMYLASE in the last 168 hours. No results for input(s): AMMONIA in the last 168 hours. Coagulation Profile: No results for  input(s): INR, PROTIME in the last 168 hours. Cardiac Enzymes: No results for input(s): CKTOTAL, CKMB, CKMBINDEX, TROPONINI in the last 168 hours. BNP (last 3 results) No results for input(s): PROBNP in the last 8760 hours. HbA1C: No results for input(s): HGBA1C in the last 72 hours. CBG: Recent Labs  Lab 06/11/20 2132 06/12/20 0800 06/12/20 1153  GLUCAP 112* 170* 156*   Lipid Profile: Recent Labs    06/11/20 1230  TRIG 133   Thyroid Function Tests: No results for input(s): TSH, T4TOTAL, FREET4, T3FREE, THYROIDAB in the last 72 hours. Anemia Panel: Recent Labs    06/11/20 1230 06/12/20 0357  FERRITIN 748* 725*   Sepsis Labs: Recent Labs  Lab 06/11/20 1230 06/11/20 2157  PROCALCITON <0.10  --   LATICACIDVEN 1.3 1.2    Recent Results (from the past 240 hour(s))  Blood Culture (routine x 2)     Status: None (Preliminary result)   Collection Time: 06/11/20 12:30 PM   Specimen: BLOOD  Result Value Ref Range Status   Specimen Description   Final    BLOOD RIGHT ANTECUBITAL Performed at [facility], [address]  [place], [place] [address]    Special Requests   Final    BOTTLES DRAWN AEROBIC AND ANAEROBIC Blood Culture adequate volume Performed at [facility], [address], [place], [place] [address]    Culture   Final    NO GROWTH < 24 HOURS Performed at [facility], [address] [place], [place] [address]    Report Status PENDING  Incomplete  Blood Culture (routine x 2)     Status: None (Preliminary result)   Collection Time: 06/11/20 12:40 PM   Specimen: BLOOD LEFT FOREARM  Result Value Ref Range Status   Specimen Description   Final    BLOOD LEFT FOREARM Performed at [facility], [address], [place], [place] [address]    Special Requests   Final    BOTTLES DRAWN AEROBIC ONLY Blood Culture results may not be optimal due to an inadequate volume of blood received in culture bottles Performed at [facility], [address], [place], [place] [address]    Culture   Final    NO GROWTH < 24 HOURS Performed at [facility], [address] [place], [place] [address]    Report Status PENDING  Incomplete         Radiology Studies: DG Chest Port 1 View  Result Date: 06/11/2020 CLINICAL DATA:  COVID positive with cough. EXAM: PORTABLE CHEST 1 VIEW COMPARISON:  None. FINDINGS: Areas of peripheral predominant airspace opacities within the left greater than right lung bases. There are some areas of linear opacity within bilateral lung bases. No pleural effusions. No pneumothorax. Cardiac silhouette is within normal limits. No acute osseous abnormality. IMPRESSION: Areas of peripheral predominant airspace opacities within the left greater than right lung bases, concerning for pneumonia given the clinical history. Linear scarring/atelectasis is also suspected. Electronically Signed   By: Feliberto Harts MD   On: 06/11/2020 12:42        Scheduled Meds:  albuterol  2 puff Inhalation Q6H   baricitinib  4 mg Oral Daily     enoxaparin (LOVENOX) injection  40 mg Subcutaneous Q24H   insulin aspart  0-9 Units Subcutaneous TID WC   methylPREDNISolone (SOLU-MEDROL) injection  87.5 mg Intravenous Q12H   ramipril  5 mg Oral Daily   Continuous Infusions:  remdesivir 100 mg  in NS 100 mL 100 mg (06/12/20 1056)     LOS: 1 day    Time spent: 38 minutes spent on chart review, discussion with nursing staff, consultants, updating family and interview/physical exam; more than 50% of that time was spent in counseling and/or coordination of care.    Alvira PhilipsEric J [place], DO [facility] Available via Epic secure chat 7am-7pm After these hours, please refer to coverage provider listed on [email or web address] 06/12/2020, 1:13 PM

## 2020-06-13 LAB — GLUCOSE, CAPILLARY
Glucose-Capillary: 162 mg/dL — ABNORMAL HIGH (ref 70–99)
Glucose-Capillary: 206 mg/dL — ABNORMAL HIGH (ref 70–99)
Glucose-Capillary: 172 mg/dL — ABNORMAL HIGH (ref 70–99)
Glucose-Capillary: 166 mg/dL — ABNORMAL HIGH (ref 70–99)

## 2020-06-13 LAB — CBC WITH DIFFERENTIAL/PLATELET
Abs Immature Granulocytes: 0.03 10*3/uL (ref 0.00–0.07)
Basophils Absolute: 0 10*3/uL (ref 0.0–0.1)
Basophils Relative: 0 %
Eosinophils Absolute: 0 10*3/uL (ref 0.0–0.5)
Eosinophils Relative: 0 %
HCT: 43.8 % (ref 36.0–46.0)
Hemoglobin: 14.5 g/dL (ref 12.0–15.0)
Immature Granulocytes: 1 %
Lymphocytes Relative: 17 %
Lymphs Abs: 0.6 10*3/uL — ABNORMAL LOW (ref 0.7–4.0)
MCH: 29.4 pg (ref 26.0–34.0)
MCHC: 33.1 g/dL (ref 30.0–36.0)
MCV: 88.8 fL (ref 80.0–100.0)
Monocytes Absolute: 0.5 10*3/uL (ref 0.1–1.0)
Monocytes Relative: 16 %
Neutro Abs: 2.3 10*3/uL (ref 1.7–7.7)
Neutrophils Relative %: 66 %
Platelets: 308 10*3/uL (ref 150–400)
RBC: 4.93 MIL/uL (ref 3.87–5.11)
RDW: 12.6 % (ref 11.5–15.5)
WBC: 3.5 10*3/uL — ABNORMAL LOW (ref 4.0–10.5)
nRBC: 0 % (ref 0.0–0.2)

## 2020-06-13 LAB — FERRITIN: Ferritin: 565 ng/mL — ABNORMAL HIGH (ref 11–307)

## 2020-06-13 LAB — COMPREHENSIVE METABOLIC PANEL
ALT: 96 U/L — ABNORMAL HIGH (ref 0–44)
AST: 53 U/L — ABNORMAL HIGH (ref 15–41)
Albumin: 3.6 g/dL (ref 3.5–5.0)
Alkaline Phosphatase: 66 U/L (ref 38–126)
Anion gap: 9 (ref 5–15)
BUN: 24 mg/dL — ABNORMAL HIGH (ref 6–20)
CO2: 25 mmol/L (ref 22–32)
Calcium: 9.1 mg/dL (ref 8.9–10.3)
Chloride: 105 mmol/L (ref 98–111)
Creatinine, Ser: 0.66 mg/dL (ref 0.44–1.00)
GFR calc Af Amer: >60 mL/min (ref >60)
GFR calc non Af Amer: >60 mL/min (ref >60)
Glucose, Bld: 184 mg/dL — ABNORMAL HIGH (ref 70–99)
Potassium: 4.2 mmol/L (ref 3.5–5.1)
Sodium: 139 mmol/L (ref 135–145)
Total Bilirubin: 0.5 mg/dL (ref 0.3–1.2)
Total Protein: 6.9 g/dL (ref 6.5–8.1)

## 2020-06-13 LAB — C-REACTIVE PROTEIN: CRP: 1.1 mg/dL — ABNORMAL HIGH (ref <1.0)

## 2020-06-13 LAB — MAGNESIUM: Magnesium: 2.4 mg/dL (ref 1.7–2.4)

## 2020-06-13 LAB — D-DIMER, QUANTITATIVE: D-Dimer, Quant: 0.44 ug/mL-FEU (ref 0.00–0.50)

## 2020-06-13 NOTE — Evaluation (Signed)
Physical Therapy Evaluation Patient Details Name: Amber Baker MRN: [id number] DOB: 1965-10-21 Today's Date: 06/13/2020   History of Present Illness  Patient is a 54 year old female with past medical history of hypertension who presented to the ED with worsening shortness of breath and hypoxemia after recently being diagnosed with COVID-19 at a [facility] urgent care on 8/30. Patient with worsening cough resulting in increased SOB therefore called 911 with O2 at 84% on room air.  Clinical Impression  Pt admitted with above diagnosis.  Pt currently with functional limitations due to the deficits listed below (see PT Problem List). Pt will benefit from skilled PT to increase their independence and safety with mobility to allow discharge to the venue listed below.  Pt on 8 L of o2 and ambulated in room with o2 dropping to 83%. Education on pursed lip breathing techniques as well as exercises she can perform in her room.  Do not feel pt will need any follow up PT     Follow Up Recommendations No PT follow up    Equipment Recommendations  None recommended by PT    Recommendations for Other Services       Precautions / Restrictions Precautions Precaution Comments: monitor vitals Restrictions Weight Bearing Restrictions: No      Mobility  Bed Mobility Overal bed mobility: Independent                Transfers Overall transfer level: Needs assistance Equipment used: None Transfers: Sit to/from Stand Sit to Stand: Supervision         General transfer comment: S for lines  Ambulation/Gait Ambulation/Gait assistance: Min guard Gait Distance (Feet): 30 Feet (+40') Assistive device: None Gait Pattern/deviations: Step-through pattern Gait velocity: decreased   General Gait Details: slow hesistant gait on 8L/o2. O2 dropped to 83% after 1st gait and took 5 minutes to get back to 87%. After 2nd gait, o2 dropped again to 83% , but rebounded to 87% within 45 seconds.  Stairs             Wheelchair Mobility    Modified Rankin (Stroke Patients Only)       Balance Overall balance assessment: Mild deficits observed, not formally tested                                           Pertinent Vitals/Pain Pain Assessment: No/denies pain    Home Living Family/patient expects to be discharged to:: Private residence Living Arrangements: Spouse/significant other;Children Available Help at Discharge: Family (DTR working from home) Type of Home: House Home Access: Stairs to enter Entrance Stairs-Rails: Doctor, general practice of Steps: 3-4 steps to front porch Home Layout: One level Home Equipment: Shower seat      Prior Function Level of Independence: Independent               Hand Dominance   Dominant Hand: Left    Extremity/Trunk Assessment   Upper Extremity Assessment Upper Extremity Assessment: Defer to OT evaluation    Lower Extremity Assessment Lower Extremity Assessment: Overall WFL for tasks assessed       Communication   Communication: No difficulties  Cognition Arousal/Alertness: Awake/alert Behavior During Therapy: WFL for tasks assessed/performed Overall Cognitive Status: Within Functional Limits for tasks assessed  General Comments      Exercises Low Level/ICU Exercises Ankle Circles/Pumps: AROM;15 reps;Both Hip ABduction/ADduction: AROM;5 reps Heel Slides: AROM;5 reps Other Exercises Other Exercises: hooklying trunk rotation   Assessment/Plan    PT Assessment Patient needs continued PT services  PT Problem List Decreased strength;Decreased activity tolerance;Decreased balance;Decreased mobility;Cardiopulmonary status limiting activity       PT Treatment Interventions DME instruction;Gait training;Functional mobility training;Balance training;Therapeutic exercise;Therapeutic activities    PT Goals (Current goals can be found in the Care  Plan section)  Acute Rehab PT Goals Patient Stated Goal: feel better PT Goal Formulation: With patient Time For Goal Achievement: 06/27/20 Potential to Achieve Goals: Good    Frequency Min 3X/week   Barriers to discharge        Co-evaluation               AM-PAC PT "6 Clicks" Mobility  Outcome Measure Help needed turning from your back to your side while in a flat bed without using bedrails?: None Help needed moving from lying on your back to sitting on the side of a flat bed without using bedrails?: None Help needed moving to and from a bed to a chair (including a wheelchair)?: A Little Help needed standing up from a chair using your arms (e.g., wheelchair or bedside chair)?: A Little Help needed to walk in hospital room?: A Little Help needed climbing 3-5 steps with a railing? : A Little 6 Click Score: 20    End of Session Equipment Utilized During Treatment: Oxygen Activity Tolerance: Patient limited by fatigue Patient left: in bed;with call bell/phone within reach Nurse Communication: Mobility status (nurse tech) PT Visit Diagnosis: Difficulty in walking, not elsewhere classified (R26.2);Muscle weakness (generalized) (M62.81)    Time: 0947-0962 PT Time Calculation (min) (ACUTE ONLY): 23 min   Charges:   PT Evaluation $PT Eval Low Complexity: 1 Low PT Treatments $Gait Training: 8-22 mins        Amber Baker, Amber Baker Pager [phone] 06/13/2020   Enzo Montgomery 06/13/2020, 4:39 PM

## 2020-06-13 NOTE — Progress Notes (Signed)
Occupational Therapy Evaluation  Patient with functional deficits listed below impacting safety with self care. Patient on 7L HFNC 90-91% resting in bed upon arrival, patient requesting to use bathroom. Pt on 8L portable tank to ambulate to bathroom with supervision, after toileting stood sink side to brush teeth and wash face which patient endorses shortness of breath. Upon returning EOB patient desat to 79% on 8L, educate patient in pursed lip breathing techniques, patient return to supine and recover to 91% in approx 30 seconds. Return patient to wall O2 on 7L with saturations maintained.     06/13/20 1325  OT Visit Information  Last OT Received On 06/13/20  Assistance Needed +1  History of Present Illness Patient is a 54 year old female with past medical history of hypertension who presented to the ED with worsening shortness of breath and hypoxemia after recently being diagnosed with COVID-19 at a [facility] urgent care on 8/30. Patient with worsening cough resulting in increased SOB therefore called 911 with O2 at 84% on room air.  Precautions  Precaution Comments monitor vitals  Restrictions  Weight Bearing Restrictions No  Home Living  Family/patient expects to be discharged to: Private residence  Living Arrangements Spouse/significant other;Children  Available Help at Discharge Family (DTR working from home)  Type of Home House  Home Access Stairs to enter  Entrance Stairs-Number of Steps 3-4 steps to front porch  Entrance Stairs-Rails Right;Left  Home Layout One level  Bathroom Shower/Tub Walk-in Chartered loss adjuster seat  Prior Function  Level of Independence Independent  Communication  Communication No difficulties  Cognition  Arousal/Alertness Awake/alert  Behavior During Therapy WFL for tasks assessed/performed  Overall Cognitive Status Within Functional Limits for tasks assessed  Upper Extremity Assessment  Upper Extremity Assessment  Overall WFL for tasks assessed  Lower Extremity Assessment  Lower Extremity Assessment Defer to PT evaluation  ADL  Overall ADL's  Needs assistance/impaired  Grooming Oral care;Wash/dry face;Wash/dry hands;Supervision/safety;Standing  Upper Body Bathing Set up;Sitting  Lower Body Bathing Supervison/ safety;Sit to/from stand;Sitting/lateral leans  Upper Body Dressing  Set up;Sitting  Lower Body Dressing Supervision/safety;Sitting/lateral leans;Sit to/from Scientist, research (life sciences) Supervision/safety;Cueing for safety;Ambulation;Regular Toilet  Toileting- Clothing Manipulation and Hygiene Supervision/safety;Sitting/lateral lean  Functional mobility during ADLs Supervision/safety  General ADL Comments patient demonstrating decreased activity tolerance and desaturation to 79% on 8L HFNC with toileting and g/h tasks. Initiate education on pursed lip and diaphragmatic breathing with cues for technique during recovery  Bed Mobility  Overal bed mobility Modified Independent  Transfers  Overall transfer level Needs assistance  Equipment used None  Transfers Sit to/from Stand  Sit to Stand Supervision  Balance  Overall balance assessment Mild deficits observed, not formally tested  OT - End of Session  Equipment Utilized During Treatment Oxygen  Activity Tolerance Patient limited by fatigue;Patient tolerated treatment well  Patient left in bed;with call bell/phone within reach  Nurse Communication Mobility status;Other (comment) (O2 saturations with activity)  OT Assessment  OT Recommendation/Assessment Patient needs continued OT Services  OT Visit Diagnosis Other abnormalities of gait and mobility (R26.89)  OT Problem List Decreased activity tolerance;Cardiopulmonary status limiting activity  OT Plan  OT Frequency (ACUTE ONLY) Min 2X/week  OT Treatment/Interventions (ACUTE ONLY) Energy conservation;DME and/or AE instruction;Therapeutic activities;Patient/family education  AM-PAC OT "6 Clicks"  Daily Activity Outcome Measure (Version 2)  Help from another person eating meals? 4  Help from another person taking care of personal grooming? 3  Help from another person toileting,  which includes using toliet, bedpan, or urinal? 3  Help from another person bathing (including washing, rinsing, drying)? 3  Help from another person to put on and taking off regular upper body clothing? 3  Help from another person to put on and taking off regular lower body clothing? 3  6 Click Score 19  OT Recommendation  Follow Up Recommendations No OT follow up  OT Equipment None recommended by OT  Individuals Consulted  Consulted and Agree with Results and Recommendations Patient  Acute Rehab OT Goals  Patient Stated Goal feel better  OT Goal Formulation With patient  Time For Goal Achievement 06/27/20  Potential to Achieve Goals Good  OT Time Calculation  OT Start Time (ACUTE ONLY) 1109  OT Stop Time (ACUTE ONLY) 1124  OT Time Calculation (min) 15 min  OT General Charges  $OT Visit 1 Visit  OT Evaluation  $OT Eval Low Complexity 1 Low  Written Expression  Dominant Hand Left   Marlyce Huge OT OT pager: [phone]

## 2020-06-13 NOTE — Progress Notes (Signed)
PROGRESS NOTE    Amber Catholicammy T Shaddock  ZOX:[id number] DOB: 12/25/1965 DOA: 06/11/2020 PCP: Emi BelfastGessner, Deborah B, FNP    Brief Narrative:  Amber Baker is a 54 year old female with past medical history of hypertension who presented to the ED with worsening shortness of breath and hypoxemia after recently being diagnosed with COVID-19 at a [facility] urgent care on 8/30.  Reportedly her symptoms began on 8/23 and had worsened prompting her to initially seek care at an urgent care where she was prescribed prednisone and doxycycline.  She states she only took about 2 days worth of these medications so far.  Reports that her cough has worsened and resulted in significant shortness of breath this a.m. prompting her to call 911.  EMS found patient to be high toxemic at 84% on room air and she was titrated up to 4 L/min nasal cannula and was not given any additional treatment in route.  States that she has had fever up to 101.1F at home, some change in taste, diarrhea, cough is intermittently productive.  Was at a family funeral about 2 weeks ago which seems to have been a super spreader event for the family.  She has not been vaccinated against COVID-19  ED Course: Vitals: T 98.4, P 93, respirations up to 40s at bedside, BP 132/86, SPO2 93% on 4 L/min O2.  Notable labs: AST 70, ALT 79, LDH 297, ferritin 748, CRP 2.5, lactic acid 1.3, WBC 7.4, Hb 16.2, D-dimer 0.53, fibrinogen 550.  Beta hCG 6 (negative is <5).  CXR with peripheral predominant airspace opacities in the L> R lung bases concerning for pneumonia given clinical history. She was started on remdesivir.   Assessment & Plan:   Principal Problem:   Acute hypoxemic respiratory failure due to COVID-19 [facility](HCC) Active Problems:   Essential hypertension, benign   Abnormal transaminases   Acute hypoxic respiratory failure secondary to acute Covid-19 viral pneumonia during the ongoing 2020 Covid 19 Pandemic - POA Patient presenting to ED after progressive shortness  of breath.  Recent Covid-19 test positive on 06/08/2020.  Initial presentation she was found to be hypoxic with SPO2 84% on room air.  Fever up to 101.8.  Patient is unvaccinated. --COVID test: + [facility] --CRP 2.5>3.1>1.1 --ddimer 0.53>0.44>0.44 --Remdesivir, plan 5-day course (Day #3/5) --Baricitinib 4mg  PO daily (Day #3/14) --Continue Solumedrol 87.5mg  IV q12h --prone for 2-3hrs every 12hrs if able --Continue supplemental oxygen, titrate to maintain SPO2 greater than 92%, currently on 7 L nasal cannula with SPO2 95% --Continue supportive care with albuterol MDI prn, vitamin C, zinc, Tylenol, antitussives (benzonatate/ Mucinex/Tussionex) --Follow CBC, CMP, D-dimer, ferritin, and CRP daily --Continue airborne/contact isolation precautions for 3 weeks from the day of diagnosis  The treatment plan and use of medications and known side effects were discussed with patient/family. Some of the medications used are based on case reports/anecdotal data.  All other medications being used in the management of COVID-19 based on limited study data.  Complete risks and long-term side effects are unknown, however in the best clinical judgment they seem to be of some benefit.  Patient wanted to proceed with treatment options provided.  Essential hypertension BP 136/90, well controlled. --ramipril 5 mg p.o. daily  Minimally elevated beta hCG Resulted at 6, normal <5; Etiology likely false positive. --Urine pregnancy test pending  Weakness, debility, deconditioning: --PT evaluation pending   DVT prophylaxis: Lovenox Code Status: Full code Family Communication: Updated patient extensively at bedside, attempted to contact patient's spouse French Anaracy via  telephone unsuccessful  Disposition Plan:  Status is: Inpatient  Remains inpatient appropriate because:Ongoing diagnostic testing needed not appropriate for outpatient work up, Unsafe d/c plan, IV treatments appropriate due to intensity of illness  or inability to take PO and Inpatient level of care appropriate due to severity of illness   Dispo: The patient is from: Home              Anticipated d/c is to: Home              Anticipated d/c date is: 3 days              Patient currently is not medically stable to d/c.   Consultants:   none  Procedures:   none  Antimicrobials:   Baricitinib 9/2>>  Remdesivir 9/2>>   Subjective: Patient seen and examined bedside, resting comfortably.  Continues with mild dyspnea at rest and worse with exertion.  No other complaints or concerns at this time.  Continues on 7 L nasal cannula.  Denies headache, no fever/chills/night sweats, no nausea/vomiting/diarrhea, no chest pain, palpitations, no abdominal pain.  No acute events overnight per nurse staff.  Objective: Vitals:   06/12/20 0922 06/12/20 1331 06/12/20 2047 06/13/20 0547  BP: 134/90 (!) 158/91 (!) 137/92 (!) 140/92  Pulse: 82 71 70 69  Resp: 16 20 20 16   Temp: 98.1 F (36.7 C) 98.1 F (36.7 C) 98.6 F (37 C) 98.1 F (36.7 C)  TempSrc: Oral     SpO2: 93% 90% 90% 95%    Intake/Output Summary (Last 24 hours) at 06/13/2020 1114 Last data filed at 06/12/2020 1500 Gross per 24 hour  Intake 692 ml  Output --  Net 692 ml   There were no vitals filed for this visit.  Examination:  General exam: Appears calm and comfortable  Respiratory system: Breath sounds decreased bilateral bases, normal respiratory effort, on 7 L nasal cannula with SPO2 95% at rest Cardiovascular system: S1 & S2 heard, RRR. No JVD, murmurs, rubs, gallops or clicks. No pedal edema. Gastrointestinal system: Abdomen is nondistended, soft and nontender. No organomegaly or masses felt. Normal bowel sounds heard. Central nervous system: Alert and oriented. No focal neurological deficits. Extremities: Symmetric 5 x 5 power. Skin: No rashes, lesions or ulcers Psychiatry: Judgement and insight appear normal. Mood & affect appropriate.     Data Reviewed: I  have personally reviewed following labs and imaging studies  CBC: Recent Labs  Lab 06/11/20 1230 06/11/20 2157 06/12/20 0357 06/13/20 0407  WBC 7.4 7.0 4.2 3.5*  NEUTROABS 6.2  --  3.5 2.3  HGB 16.2* 14.9 14.4 14.5  HCT 48.8* 46.0 44.2 43.8  MCV 88.4 90.0 89.7 88.8  PLT 261 282 221 308   Basic Metabolic Panel: Recent Labs  Lab 06/11/20 1230 06/11/20 2157 06/12/20 0357 06/13/20 0407  NA 139  --  138 139  K 4.1  --  4.4 4.2  CL 103  --  103 105  CO2 25  --  25 25  GLUCOSE 99  --  179* 184*  BUN 14  --  19 24*  CREATININE 0.60 0.67 0.70 0.66  CALCIUM 9.2  --  8.7* 9.1  MG  --   --  2.2 2.4   GFR: CrCl cannot be calculated (Unknown ideal weight.). Liver Function Tests: Recent Labs  Lab 06/11/20 1230 06/12/20 0357 06/13/20 0407  AST 70* 73* 53*  ALT 79* 89* 96*  ALKPHOS 77 67 66  BILITOT 0.6 0.6 0.5  PROT 7.7 6.7 6.9  ALBUMIN 4.1 3.6 3.6   No results for input(s): LIPASE, AMYLASE in the last 168 hours. No results for input(s): AMMONIA in the last 168 hours. Coagulation Profile: No results for input(s): INR, PROTIME in the last 168 hours. Cardiac Enzymes: No results for input(s): CKTOTAL, CKMB, CKMBINDEX, TROPONINI in the last 168 hours. BNP (last 3 results) No results for input(s): PROBNP in the last 8760 hours. HbA1C: No results for input(s): HGBA1C in the last 72 hours. CBG: Recent Labs  Lab 06/12/20 0800 06/12/20 1153 06/12/20 1610 06/12/20 2049 06/13/20 0813  GLUCAP 170* 156* 150* 157* 162*   Lipid Profile: Recent Labs    06/11/20 1230  TRIG 133   Thyroid Function Tests: No results for input(s): TSH, T4TOTAL, FREET4, T3FREE, THYROIDAB in the last 72 hours. Anemia Panel: Recent Labs    06/12/20 0357 06/13/20 0407  FERRITIN 725* 565*   Sepsis Labs: Recent Labs  Lab 06/11/20 1230 06/11/20 2157  PROCALCITON <0.10  --   LATICACIDVEN 1.3 1.2    Recent Results (from the past 240 hour(s))  Blood Culture (routine x 2)     Status: None  (Preliminary result)   Collection Time: 06/11/20 12:30 PM   Specimen: BLOOD  Result Value Ref Range Status   Specimen Description   Final    BLOOD RIGHT ANTECUBITAL Performed at [facility], [address], [place], [place] [address]    Special Requests   Final    BOTTLES DRAWN AEROBIC AND ANAEROBIC Blood Culture adequate volume Performed at [facility], [address] [place], [place] [address]    Culture   Final    NO GROWTH < 24 HOURS Performed at [facility], [address] [place], [place] [address]    Report Status PENDING  Incomplete  Blood Culture (routine x 2)     Status: None (Preliminary result)   Collection Time: 06/11/20 12:40 PM   Specimen: BLOOD LEFT FOREARM  Result Value Ref Range Status   Specimen Description   Final    BLOOD LEFT FOREARM Performed at [facility], [address] [place], [place] [address]    Special Requests   Final    BOTTLES DRAWN AEROBIC ONLY Blood Culture results may not be optimal due to an inadequate volume of blood received in culture bottles Performed at [facility], [address] [place], [place] [address]    Culture   Final    NO GROWTH < 24 HOURS Performed at [facility], [address] [place], [place] [address]    Report Status PENDING  Incomplete         Radiology Studies: DG Chest Port 1 View  Result Date: 06/11/2020 CLINICAL DATA:  COVID positive with cough. EXAM: PORTABLE CHEST 1 VIEW COMPARISON:  None. FINDINGS: Areas of peripheral predominant airspace opacities within the left greater than right lung bases. There are some areas of linear opacity within bilateral lung bases. No pleural effusions. No pneumothorax. Cardiac silhouette is within normal limits. No acute osseous abnormality. IMPRESSION: Areas of peripheral predominant airspace opacities within the left greater than right lung bases, concerning for pneumonia given  the clinical history. Linear scarring/atelectasis is also suspected. Electronically Signed   By: Feliberto Harts MD   On: 06/11/2020 12:42        Scheduled Meds:  albuterol  2 puff Inhalation Q6H   baricitinib  4 mg Oral Daily   enoxaparin (LOVENOX)  injection  40 mg Subcutaneous Q24H   insulin aspart  0-9 Units Subcutaneous TID WC   methylPREDNISolone (SOLU-MEDROL) injection  87.5 mg Intravenous Q12H   ramipril  5 mg Oral Daily   Continuous Infusions:  remdesivir 100 mg in NS 100 mL 100 mg (06/13/20 1026)     LOS: 2 days    Time spent: 38 minutes spent on chart review, discussion with nursing staff, consultants, updating family and interview/physical exam; more than 50% of that time was spent in counseling and/or coordination of care.    Alvira Philips [place], DO [facility] Available via Epic secure chat 7am-7pm After these hours, please refer to coverage provider listed on [email or web address] 06/13/2020, 11:14 AM

## 2020-06-14 LAB — GLUCOSE, CAPILLARY
Glucose-Capillary: 174 mg/dL — ABNORMAL HIGH (ref 70–99)
Glucose-Capillary: 246 mg/dL — ABNORMAL HIGH (ref 70–99)
Glucose-Capillary: 160 mg/dL — ABNORMAL HIGH (ref 70–99)
Glucose-Capillary: 173 mg/dL — ABNORMAL HIGH (ref 70–99)

## 2020-06-14 LAB — CBC WITH DIFFERENTIAL/PLATELET
Abs Immature Granulocytes: 0.08 10*3/uL — ABNORMAL HIGH (ref 0.00–0.07)
Basophils Absolute: 0 10*3/uL (ref 0.0–0.1)
Basophils Relative: 0 %
Eosinophils Absolute: 0 10*3/uL (ref 0.0–0.5)
Eosinophils Relative: 0 %
HCT: 42.4 % (ref 36.0–46.0)
Hemoglobin: 14 g/dL (ref 12.0–15.0)
Immature Granulocytes: 1 %
Lymphocytes Relative: 9 %
Lymphs Abs: 0.6 10*3/uL — ABNORMAL LOW (ref 0.7–4.0)
MCH: 29.4 pg (ref 26.0–34.0)
MCHC: 33 g/dL (ref 30.0–36.0)
MCV: 88.9 fL (ref 80.0–100.0)
Monocytes Absolute: 0.8 10*3/uL (ref 0.1–1.0)
Monocytes Relative: 12 %
Neutro Abs: 5.1 10*3/uL (ref 1.7–7.7)
Neutrophils Relative %: 78 %
Platelets: 346 10*3/uL (ref 150–400)
RBC: 4.77 MIL/uL (ref 3.87–5.11)
RDW: 12.4 % (ref 11.5–15.5)
WBC: 6.6 10*3/uL (ref 4.0–10.5)
nRBC: 0 % (ref 0.0–0.2)

## 2020-06-14 LAB — COMPREHENSIVE METABOLIC PANEL
ALT: 175 U/L — ABNORMAL HIGH (ref 0–44)
AST: 109 U/L — ABNORMAL HIGH (ref 15–41)
Albumin: 3.5 g/dL (ref 3.5–5.0)
Alkaline Phosphatase: 61 U/L (ref 38–126)
Anion gap: 10 (ref 5–15)
BUN: 24 mg/dL — ABNORMAL HIGH (ref 6–20)
CO2: 25 mmol/L (ref 22–32)
Calcium: 9 mg/dL (ref 8.9–10.3)
Chloride: 105 mmol/L (ref 98–111)
Creatinine, Ser: 0.61 mg/dL (ref 0.44–1.00)
GFR calc Af Amer: >60 mL/min (ref >60)
GFR calc non Af Amer: >60 mL/min (ref >60)
Glucose, Bld: 186 mg/dL — ABNORMAL HIGH (ref 70–99)
Potassium: 4.3 mmol/L (ref 3.5–5.1)
Sodium: 140 mmol/L (ref 135–145)
Total Bilirubin: 0.6 mg/dL (ref 0.3–1.2)
Total Protein: 6.7 g/dL (ref 6.5–8.1)

## 2020-06-14 LAB — C-REACTIVE PROTEIN: CRP: <0.5 mg/dL (ref <1.0)

## 2020-06-14 LAB — FERRITIN: Ferritin: 687 ng/mL — ABNORMAL HIGH (ref 11–307)

## 2020-06-14 LAB — D-DIMER, QUANTITATIVE: D-Dimer, Quant: 0.41 ug/mL-FEU (ref 0.00–0.50)

## 2020-06-14 LAB — MAGNESIUM: Magnesium: 2.5 mg/dL — ABNORMAL HIGH (ref 1.7–2.4)

## 2020-06-14 MED ORDER — INSULIN ASPART 100 UNIT/ML ~~LOC~~ SOLN
0.0000 [IU] | Freq: Three times a day (TID) | SUBCUTANEOUS | Status: DC
  Administered 2020-06-14 – 2020-06-15 (×2): 3 [IU] via SUBCUTANEOUS
  Administered 2020-06-15: 5 [IU] via SUBCUTANEOUS
  Administered 2020-06-15: 3 [IU] via SUBCUTANEOUS

## 2020-06-14 MED ORDER — INSULIN ASPART 100 UNIT/ML ~~LOC~~ SOLN: 0.0000 [IU] | Freq: Every day | SUBCUTANEOUS | Status: DC

## 2020-06-14 NOTE — Progress Notes (Signed)
PROGRESS NOTE    BRYNJA MARKER  EQA:[id number] DOB: Apr 21, 1966 DOA: 06/11/2020 PCP: Emi Belfast, FNP    Brief Narrative:  Amber Baker is a 54 year old female with past medical history of hypertension who presented to the ED with worsening shortness of breath and hypoxemia after recently being diagnosed with COVID-19 at a [facility] urgent care on 8/30.  Reportedly her symptoms began on 8/23 and had worsened prompting her to initially seek care at an urgent care where she was prescribed prednisone and doxycycline.  She states she only took about 2 days worth of these medications so far.  Reports that her cough has worsened and resulted in significant shortness of breath this a.m. prompting her to call 911.  EMS found patient to be high toxemic at 84% on room air and she was titrated up to 4 L/min nasal cannula and was not given any additional treatment in route.  States that she has had fever up to 101.64F at home, some change in taste, diarrhea, cough is intermittently productive.  Was at a family funeral about 2 weeks ago which seems to have been a super spreader event for the family.  She has not been vaccinated against COVID-19  ED Course: Vitals: T 98.4, P 93, respirations up to 40s at bedside, BP 132/86, SPO2 93% on 4 L/min O2.  Notable labs: AST 70, ALT 79, LDH 297, ferritin 748, CRP 2.5, lactic acid 1.3, WBC 7.4, Hb 16.2, D-dimer 0.53, fibrinogen 550.  Beta hCG 6 (negative is <5).  CXR with peripheral predominant airspace opacities in the L> R lung bases concerning for pneumonia given clinical history. She was started on remdesivir.   Assessment & Plan:   Principal Problem:   Acute hypoxemic respiratory failure due to COVID-19 [facility]) Active Problems:   Essential hypertension, benign   Abnormal transaminases   Acute hypoxic respiratory failure secondary to acute Covid-19 viral pneumonia during the ongoing 2020 Covid 19 Pandemic - POA Patient presenting to ED after progressive shortness  of breath.  Recent Covid-19 test positive on 06/08/2020.  Initial presentation she was found to be hypoxic with SPO2 84% on room air.  Fever up to 101.8.  Patient is unvaccinated. --COVID test: + PCR 8/30 from [facility] --CRP 2.5>3.1>1.1> <0.5 --ddimer 0.53>0.44>0.44>0.41 --Remdesivir, plan 5-day course (Day #4/5) --Baricitinib 4mg  PO daily (Day #4/14) --Continue Solumedrol 87.5mg  IV q12h --prone for 2-3hrs every 12hrs if able --Continue supplemental oxygen, titrate to maintain SPO2 greater than 92%, currently on 7 L nasal cannula with SPO2 94% --Continue supportive care with albuterol MDI prn, vitamin C, zinc, Tylenol, antitussives (benzonatate/ Mucinex/Tussionex) --Follow CBC, CMP, D-dimer, ferritin, and CRP daily --Continue airborne/contact isolation precautions for 3 weeks from the day of diagnosis  The treatment plan and use of medications and known side effects were discussed with patient/family. Some of the medications used are based on case reports/anecdotal data.  All other medications being used in the management of COVID-19 based on limited study data.  Complete risks and long-term side effects are unknown, however in the best clinical judgment they seem to be of some benefit.  Patient wanted to proceed with treatment options provided.  Essential hypertension BP 136/90, well controlled. --ramipril 5 mg p.o. daily  Minimally elevated beta hCG Resulted at 6, normal <5; Etiology likely false positive. --Urine pregnancy test pending  Weakness, debility, deconditioning: --Seen by PT/OT with no recommendations   DVT prophylaxis: Lovenox Code Status: Full code Family Communication: Updated patient extensively at bedside, attempted to contact  patient's spouse French Anaracy via telephone unsuccessful  Disposition Plan:  Status is: Inpatient  Remains inpatient appropriate because:Ongoing diagnostic testing needed not appropriate for outpatient work up, Unsafe d/c plan, IV treatments appropriate  due to intensity of illness or inability to take PO and Inpatient level of care appropriate due to severity of illness   Dispo: The patient is from: Home              Anticipated d/c is to: Home              Anticipated d/c date is: 2 days              Patient currently is not medically stable to d/c.   Consultants:   none  Procedures:   none  Antimicrobials:   Baricitinib 9/2>>  Remdesivir 9/2>>   Subjective: Patient seen and examined bedside, resting comfortably.  Continues with mild dyspnea at rest, slowly improving daily.  No other complaints or concerns at this time.  Continues on 7 L nasal cannula.  Denies headache, no fever/chills/night sweats, no nausea/vomiting/diarrhea, no chest pain, palpitations, no abdominal pain.  No acute events overnight per nurse staff.  Objective: Vitals:   06/13/20 0547 06/13/20 1549 06/13/20 2211 06/14/20 0605  BP: (!) 140/92 (!) 145/92 (!) 148/92 (!) 144/93  Pulse: 69 71 67 66  Resp: 16 20 16 16   Temp: 98.1 F (36.7 C) 97.9 F (36.6 C) 98.3 F (36.8 C) 98.3 F (36.8 C)  TempSrc:      SpO2: 95% 93% 92% 94%  Weight:    85.7 kg  Height:    5\' 4"  (1.626 m)   No intake or output data in the 24 hours ending 06/14/20 1105 Filed Weights   06/14/20 0605  Weight: 85.7 kg    Examination:  General exam: Appears calm and comfortable  Respiratory system: Breath sounds decreased bilateral bases, normal respiratory effort, on 7 L nasal cannula with SPO2 94% at rest Cardiovascular system: S1 & S2 heard, RRR. No JVD, murmurs, rubs, gallops or clicks. No pedal edema. Gastrointestinal system: Abdomen is nondistended, soft and nontender. No organomegaly or masses felt. Normal bowel sounds heard. Central nervous system: Alert and oriented. No focal neurological deficits. Extremities: Symmetric 5 x 5 power. Skin: No rashes, lesions or ulcers Psychiatry: Judgement and insight appear normal. Mood & affect appropriate.     Data Reviewed: I  have personally reviewed following labs and imaging studies  CBC: Recent Labs  Lab 06/11/20 1230 06/11/20 2157 06/12/20 0357 06/13/20 0407 06/14/20 0526  WBC 7.4 7.0 4.2 3.5* 6.6  NEUTROABS 6.2  --  3.5 2.3 5.1  HGB 16.2* 14.9 14.4 14.5 14.0  HCT 48.8* 46.0 44.2 43.8 42.4  MCV 88.4 90.0 89.7 88.8 88.9  PLT 261 282 221 308 346   Basic Metabolic Panel: Recent Labs  Lab 06/11/20 1230 06/11/20 2157 06/12/20 0357 06/13/20 0407 06/14/20 0526  NA 139  --  138 139 140  K 4.1  --  4.4 4.2 4.3  CL 103  --  103 105 105  CO2 25  --  25 25 25   GLUCOSE 99  --  179* 184* 186*  BUN 14  --  19 24* 24*  CREATININE 0.60 0.67 0.70 0.66 0.61  CALCIUM 9.2  --  8.7* 9.1 9.0  MG  --   --  2.2 2.4 2.5*   GFR: Estimated Creatinine Clearance: 86.1 mL/min (by C-G formula based on SCr of 0.61 mg/dL). Liver Function Tests: Recent  Labs  Lab 06/11/20 1230 06/12/20 0357 06/13/20 0407 06/14/20 0526  AST 70* 73* 53* 109*  ALT 79* 89* 96* 175*  ALKPHOS 77 67 66 61  BILITOT 0.6 0.6 0.5 0.6  PROT 7.7 6.7 6.9 6.7  ALBUMIN 4.1 3.6 3.6 3.5   No results for input(s): LIPASE, AMYLASE in the last 168 hours. No results for input(s): AMMONIA in the last 168 hours. Coagulation Profile: No results for input(s): INR, PROTIME in the last 168 hours. Cardiac Enzymes: No results for input(s): CKTOTAL, CKMB, CKMBINDEX, TROPONINI in the last 168 hours. BNP (last 3 results) No results for input(s): PROBNP in the last 8760 hours. HbA1C: No results for input(s): HGBA1C in the last 72 hours. CBG: Recent Labs  Lab 06/13/20 0813 06/13/20 1209 06/13/20 1727 06/13/20 2213 06/14/20 0755  GLUCAP 162* 206* 172* 166* 174*   Lipid Profile: Recent Labs    06/11/20 1230  TRIG 133   Thyroid Function Tests: No results for input(s): TSH, T4TOTAL, FREET4, T3FREE, THYROIDAB in the last 72 hours. Anemia Panel: Recent Labs    06/13/20 0407 06/14/20 0526  FERRITIN 565* 687*   Sepsis Labs: Recent Labs  Lab  06/11/20 1230 06/11/20 2157  PROCALCITON <0.10  --   LATICACIDVEN 1.3 1.2    Recent Results (from the past 240 hour(s))  Blood Culture (routine x 2)     Status: None (Preliminary result)   Collection Time: 06/11/20 12:30 PM   Specimen: BLOOD  Result Value Ref Range Status   Specimen Description   Final    BLOOD RIGHT ANTECUBITAL Performed at [facility], [address] [place], [place] [address]    Special Requests   Final    BOTTLES DRAWN AEROBIC AND ANAEROBIC Blood Culture adequate volume Performed at [facility], [address] [place], [place] [address]    Culture   Final    NO GROWTH 3 DAYS Performed at [facility], [address] [place], [place] [address]    Report Status PENDING  Incomplete  Blood Culture (routine x 2)     Status: None (Preliminary result)   Collection Time: 06/11/20 12:40 PM   Specimen: BLOOD LEFT FOREARM  Result Value Ref Range Status   Specimen Description   Final    BLOOD LEFT FOREARM Performed at [facility], [address], [place], [place] [address]    Special Requests   Final    BOTTLES DRAWN AEROBIC ONLY Blood Culture results may not be optimal due to an inadequate volume of blood received in culture bottles Performed at [facility], [address], [place], [place] [address]    Culture   Final    NO GROWTH 3 DAYS Performed at [facility], [address], [place], [place] [address]    Report Status PENDING  Incomplete         Radiology Studies: No results found.      Scheduled Meds: . albuterol  2 puff Inhalation Q6H  . baricitinib  4 mg Oral Daily  . enoxaparin (LOVENOX) injection  40 mg Subcutaneous Q24H  . insulin aspart  0-9 Units Subcutaneous TID WC  . methylPREDNISolone (SOLU-MEDROL) injection  87.5 mg Intravenous Q12H  . ramipril  5 mg Oral Daily   Continuous Infusions: . remdesivir 100 mg in NS 100 mL 100 mg  (06/14/20 0840)     LOS: 3 days    Time spent: 35 minutes spent on chart review, discussion  with nursing staff, consultants, updating family and interview/physical exam; more than 50% of that time was spent in counseling and/or coordination of care.    Alvira Philips [place], DO [facility] Available via Epic secure chat 7am-7pm After these hours, please refer to coverage provider listed on [email or web address] 06/14/2020, 11:05 AM

## 2020-06-15 LAB — COMPREHENSIVE METABOLIC PANEL
ALT: 196 U/L — ABNORMAL HIGH (ref 0–44)
AST: 70 U/L — ABNORMAL HIGH (ref 15–41)
Albumin: 3.5 g/dL (ref 3.5–5.0)
Alkaline Phosphatase: 62 U/L (ref 38–126)
Anion gap: 11 (ref 5–15)
BUN: 23 mg/dL — ABNORMAL HIGH (ref 6–20)
CO2: 24 mmol/L (ref 22–32)
Calcium: 9.1 mg/dL (ref 8.9–10.3)
Chloride: 105 mmol/L (ref 98–111)
Creatinine, Ser: 0.64 mg/dL (ref 0.44–1.00)
GFR calc Af Amer: >60 mL/min (ref >60)
GFR calc non Af Amer: >60 mL/min (ref >60)
Glucose, Bld: 201 mg/dL — ABNORMAL HIGH (ref 70–99)
Potassium: 4.6 mmol/L (ref 3.5–5.1)
Sodium: 140 mmol/L (ref 135–145)
Total Bilirubin: 0.5 mg/dL (ref 0.3–1.2)
Total Protein: 6.3 g/dL — ABNORMAL LOW (ref 6.5–8.1)

## 2020-06-15 LAB — GLUCOSE, CAPILLARY
Glucose-Capillary: 193 mg/dL — ABNORMAL HIGH (ref 70–99)
Glucose-Capillary: 250 mg/dL — ABNORMAL HIGH (ref 70–99)
Glucose-Capillary: 159 mg/dL — ABNORMAL HIGH (ref 70–99)
Glucose-Capillary: 190 mg/dL — ABNORMAL HIGH (ref 70–99)

## 2020-06-15 LAB — CBC WITH DIFFERENTIAL/PLATELET
Abs Immature Granulocytes: 0.16 10*3/uL — ABNORMAL HIGH (ref 0.00–0.07)
Basophils Absolute: 0 10*3/uL (ref 0.0–0.1)
Basophils Relative: 0 %
Eosinophils Absolute: 0 10*3/uL (ref 0.0–0.5)
Eosinophils Relative: 0 %
HCT: 42.3 % (ref 36.0–46.0)
Hemoglobin: 14 g/dL (ref 12.0–15.0)
Immature Granulocytes: 2 %
Lymphocytes Relative: 8 %
Lymphs Abs: 0.5 10*3/uL — ABNORMAL LOW (ref 0.7–4.0)
MCH: 29.4 pg (ref 26.0–34.0)
MCHC: 33.1 g/dL (ref 30.0–36.0)
MCV: 88.7 fL (ref 80.0–100.0)
Monocytes Absolute: 0.5 10*3/uL (ref 0.1–1.0)
Monocytes Relative: 8 %
Neutro Abs: 5.6 10*3/uL (ref 1.7–7.7)
Neutrophils Relative %: 82 %
Platelets: 332 10*3/uL (ref 150–400)
RBC: 4.77 MIL/uL (ref 3.87–5.11)
RDW: 12.2 % (ref 11.5–15.5)
WBC: 6.8 10*3/uL (ref 4.0–10.5)
nRBC: 0 % (ref 0.0–0.2)

## 2020-06-15 LAB — MAGNESIUM: Magnesium: 2.6 mg/dL — ABNORMAL HIGH (ref 1.7–2.4)

## 2020-06-15 LAB — D-DIMER, QUANTITATIVE: D-Dimer, Quant: 0.38 ug/mL-FEU (ref 0.00–0.50)

## 2020-06-15 LAB — C-REACTIVE PROTEIN: CRP: 0.5 mg/dL (ref <1.0)

## 2020-06-15 LAB — FERRITIN: Ferritin: 632 ng/mL — ABNORMAL HIGH (ref 11–307)

## 2020-06-15 MED ORDER — METHYLPREDNISOLONE SODIUM SUCC 40 MG IJ SOLR
40.0000 mg | Freq: Two times a day (BID) | INTRAMUSCULAR | Status: DC
  Administered 2020-06-15 – 2020-06-16 (×2): 40 mg via INTRAVENOUS
  Filled 2020-06-15 (×2): qty 1

## 2020-06-15 NOTE — Progress Notes (Signed)
SATURATION QUALIFICATIONS: (This note is used to comply with regulatory documentation for home oxygen)  Patient Saturations on Room Air at Rest = 80%  Patient Saturations on Room Air while Ambulating = 78%  Patient Saturations on 4 Liters of oxygen while Ambulating = 87%  Please briefly explain why patient needs home oxygen:

## 2020-06-15 NOTE — Progress Notes (Signed)
PROGRESS NOTE    Amber Baker  NWG:[id number] DOB: 02/08/1966 DOA: 06/11/2020 PCP: Emi Belfast, FNP    Brief Narrative:  Amber Baker is a 54 year old female with past medical history of hypertension who presented to the ED with worsening shortness of breath and hypoxemia after recently being diagnosed with COVID-19 at a [facility] urgent care on 8/30.  Reportedly her symptoms began on 8/23 and had worsened prompting her to initially seek care at an urgent care where she was prescribed prednisone and doxycycline.  She states she only took about 2 days worth of these medications so far.  Reports that her cough has worsened and resulted in significant shortness of breath this a.m. prompting her to call 911.  EMS found patient to be high toxemic at 84% on room air and she was titrated up to 4 L/min nasal cannula and was not given any additional treatment in route.  States that she has had fever up to 101.91F at home, some change in taste, diarrhea, cough is intermittently productive.  Was at a family funeral about 2 weeks ago which seems to have been a super spreader event for the family.  She has not been vaccinated against COVID-19  ED Course: Vitals: T 98.4, P 93, respirations up to 40s at bedside, BP 132/86, SPO2 93% on 4 L/min O2.  Notable labs: AST 70, ALT 79, LDH 297, ferritin 748, CRP 2.5, lactic acid 1.3, WBC 7.4, Hb 16.2, D-dimer 0.53, fibrinogen 550.  Beta hCG 6 (negative is <5).  CXR with peripheral predominant airspace opacities in the L> R lung bases concerning for pneumonia given clinical history. She was started on remdesivir.   Assessment & Plan:   Principal Problem:   Acute hypoxemic respiratory failure due to COVID-19 [facility]) Active Problems:   Essential hypertension, benign   Abnormal transaminases   Acute hypoxic respiratory failure secondary to acute Covid-19 viral pneumonia during the ongoing 2020 Covid 19 Pandemic - POA Patient presenting to ED after progressive shortness  of breath.  Recent Covid-19 test positive on 06/08/2020.  Initial presentation she was found to be hypoxic with SPO2 84% on room air.  Fever up to 101.8.  Patient is unvaccinated. --COVID test: + PCR 8/30 from [facility] --CRP 2.5>3.1>1.1> <0.5, 0.5 --ddimer 0.53>0.44>0.44>0.41>0.38 --Remdesivir, plan 5-day course (Day #5/5) --Baricitinib 4mg  PO daily (Day #5/14) --Decrease Solumedrol to 40mg  IV q12h --prone for 2-3hrs every 12hrs if able --Continue supplemental oxygen, titrate to maintain SPO2 greater than 92%, currently on 5 L nasal cannula with SPO2 91% at rest --O2 desaturation screen today --Continue supportive care with albuterol MDI prn, vitamin C, zinc, Tylenol, antitussives (benzonatate/ Mucinex/Tussionex) --Follow CBC, CMP, D-dimer, ferritin, and CRP daily --Continue airborne/contact isolation precautions for 3 weeks from the day of diagnosis  The treatment plan and use of medications and known side effects were discussed with patient/family. Some of the medications used are based on case reports/anecdotal data.  All other medications being used in the management of COVID-19 based on limited study data.  Complete risks and long-term side effects are unknown, however in the best clinical judgment they seem to be of some benefit.  Patient wanted to proceed with treatment options provided.  Essential hypertension BP 136/90, well controlled. --ramipril 5 mg p.o. daily  Minimally elevated beta hCG Resulted at 6, normal <5; Etiology likely false positive. --Urine pregnancy test pending  Weakness, debility, deconditioning: --Seen by PT/OT with no recommendations   DVT prophylaxis: Lovenox Code Status: Full code Family Communication:  Updated patient extensively at bedside  Disposition Plan:  Status is: Inpatient  Remains inpatient appropriate because:Ongoing diagnostic testing needed not appropriate for outpatient work up, Unsafe d/c plan, IV treatments appropriate due to intensity of  illness or inability to take PO and Inpatient level of care appropriate due to severity of illness   Dispo: The patient is from: Home              Anticipated d/c is to: Home              Anticipated d/c date is: 1 day              Patient currently is not medically stable to d/c.   Consultants:   none  Procedures:   none  Antimicrobials:   Baricitinib 9/2>>  Remdesivir 9/2 - 9/6   Subjective: Patient seen and examined bedside, resting comfortably.  Continues with mild dyspnea at rest, slowly improving daily.  No other complaints or concerns at this time.  Continues on 5 L nasal cannula.  Denies headache, no fever/chills/night sweats, no nausea/vomiting/diarrhea, no chest pain, palpitations, no abdominal pain.  No acute events overnight per nurse staff.  Objective: Vitals:   06/14/20 0605 06/14/20 1407 06/14/20 2111 06/15/20 0523  BP: (!) 144/93 (!) 160/97 (!) 150/98 (!) 133/91  Pulse: 66 78 64 (!) 59  Resp: 16 18 18 18   Temp: 98.3 F (36.8 C) 98.5 F (36.9 C) 98.8 F (37.1 C) 98.4 F (36.9 C)  TempSrc:    Oral  SpO2: 94% 92% 94% 91%  Weight: 85.7 kg     Height: 5\' 4"  (1.626 m)       Intake/Output Summary (Last 24 hours) at 06/15/2020 1149 Last data filed at 06/15/2020 1100 Gross per 24 hour  Intake 385 ml  Output --  Net 385 ml   Filed Weights   06/14/20 0605  Weight: 85.7 kg    Examination:  General exam: Appears calm and comfortable  Respiratory system: Breath sounds decreased bilateral bases, normal respiratory effort, on 5 L nasal cannula with SPO2 94% at rest Cardiovascular system: S1 & S2 heard, RRR. No JVD, murmurs, rubs, gallops or clicks. No pedal edema. Gastrointestinal system: Abdomen is nondistended, soft and nontender. No organomegaly or masses felt. Normal bowel sounds heard. Central nervous system: Alert and oriented. No focal neurological deficits. Extremities: Symmetric 5 x 5 power. Skin: No rashes, lesions or ulcers Psychiatry: Judgement  and insight appear normal. Mood & affect appropriate.     Data Reviewed: I have personally reviewed following labs and imaging studies  CBC: Recent Labs  Lab 06/11/20 1230 06/11/20 1230 06/11/20 2157 06/12/20 0357 06/13/20 0407 06/14/20 0526 06/15/20 0512  WBC 7.4   < > 7.0 4.2 3.5* 6.6 6.8  NEUTROABS 6.2  --   --  3.5 2.3 5.1 5.6  HGB 16.2*   < > 14.9 14.4 14.5 14.0 14.0  HCT 48.8*   < > 46.0 44.2 43.8 42.4 42.3  MCV 88.4   < > 90.0 89.7 88.8 88.9 88.7  PLT 261   < > 282 221 308 346 332   < > = values in this interval not displayed.   Basic Metabolic Panel: Recent Labs  Lab 06/11/20 1230 06/11/20 1230 06/11/20 2157 06/12/20 0357 06/13/20 0407 06/14/20 0526 06/15/20 0512  NA 139  --   --  138 139 140 140  K 4.1  --   --  4.4 4.2 4.3 4.6  CL 103  --   --  103 105 105 105  CO2 25  --   --  25 25 25 24   GLUCOSE 99  --   --  179* 184* 186* 201*  BUN 14  --   --  19 24* 24* 23*  CREATININE 0.60   < > 0.67 0.70 0.66 0.61 0.64  CALCIUM 9.2  --   --  8.7* 9.1 9.0 9.1  MG  --   --   --  2.2 2.4 2.5* 2.6*   < > = values in this interval not displayed.   GFR: Estimated Creatinine Clearance: 86.1 mL/min (by C-G formula based on SCr of 0.64 mg/dL). Liver Function Tests: Recent Labs  Lab 06/11/20 1230 06/12/20 0357 06/13/20 0407 06/14/20 0526 06/15/20 0512  AST 70* 73* 53* 109* 70*  ALT 79* 89* 96* 175* 196*  ALKPHOS 77 67 66 61 62  BILITOT 0.6 0.6 0.5 0.6 0.5  PROT 7.7 6.7 6.9 6.7 6.3*  ALBUMIN 4.1 3.6 3.6 3.5 3.5   No results for input(s): LIPASE, AMYLASE in the last 168 hours. No results for input(s): AMMONIA in the last 168 hours. Coagulation Profile: No results for input(s): INR, PROTIME in the last 168 hours. Cardiac Enzymes: No results for input(s): CKTOTAL, CKMB, CKMBINDEX, TROPONINI in the last 168 hours. BNP (last 3 results) No results for input(s): PROBNP in the last 8760 hours. HbA1C: No results for input(s): HGBA1C in the last 72  hours. CBG: Recent Labs  Lab 06/14/20 1122 06/14/20 1730 06/14/20 2107 06/15/20 0728 06/15/20 1117  GLUCAP 246* 160* 173* 193* 250*   Lipid Profile: No results for input(s): CHOL, HDL, LDLCALC, TRIG, CHOLHDL, LDLDIRECT in the last 72 hours. Thyroid Function Tests: No results for input(s): TSH, T4TOTAL, FREET4, T3FREE, THYROIDAB in the last 72 hours. Anemia Panel: Recent Labs    06/14/20 0526 06/15/20 0512  FERRITIN 687* 632*   Sepsis Labs: Recent Labs  Lab 06/11/20 1230 06/11/20 2157  PROCALCITON <0.10  --   LATICACIDVEN 1.3 1.2    Recent Results (from the past 240 hour(s))  Blood Culture (routine x 2)     Status: None (Preliminary result)   Collection Time: 06/11/20 12:30 PM   Specimen: BLOOD  Result Value Ref Range Status   Specimen Description   Final    BLOOD RIGHT ANTECUBITAL Performed at [facility], [address] [place], [place]    Special Requests   Final    BOTTLES DRAWN AEROBIC AND ANAEROBIC Blood Culture adequate volume Performed at [facility], [address], [place], [place]    Culture   Final    NO GROWTH 4 DAYS Performed at [facility], [address], [place], [place]    Report Status PENDING  Incomplete  Blood Culture (routine x 2)     Status: None (Preliminary result)   Collection Time: 06/11/20 12:40 PM   Specimen: BLOOD LEFT FOREARM  Result Value Ref Range Status   Specimen Description   Final    BLOOD LEFT FOREARM Performed at [facility], [address], [place], [place]    Special Requests   Final    BOTTLES DRAWN AEROBIC ONLY Blood Culture results may not be optimal due to an inadequate volume of blood received in culture bottles Performed at [facility], [address] [place], [place]    Culture   Final    NO GROWTH 4 DAYS Performed at [facility], [address], [place],  [place] [address]    Report Status PENDING  Incomplete         Radiology Studies: No results found.      Scheduled Meds: . albuterol  2 puff Inhalation Q6H  . baricitinib  4 mg Oral Daily  . enoxaparin (LOVENOX) injection  40 mg Subcutaneous Q24H  . insulin aspart  0-15 Units Subcutaneous TID WC  . insulin aspart  0-5 Units Subcutaneous QHS  . methylPREDNISolone (SOLU-MEDROL) injection  87.5 mg Intravenous Q12H  . ramipril  5 mg Oral Daily   Continuous Infusions:    LOS: 4 days    Time spent: 35 minutes spent on chart review, discussion with nursing staff, consultants, updating family and interview/physical exam; more than 50% of that time was spent in counseling and/or coordination of care.    Amber Philips [place], DO [facility] Available via Epic secure chat 7am-7pm After these hours, please refer to coverage provider listed on [email or web address] 06/15/2020, 11:49 AM

## 2020-06-15 NOTE — Progress Notes (Signed)
Physical Therapy Treatment Patient Details Name: Amber Baker MRN: [id number] DOB: Jul 17, 1966 Today's Date: 06/15/2020    History of Present Illness Patient is a 54 year old female with past medical history of hypertension who presented to the ED with worsening shortness of breath and hypoxemia after recently being diagnosed with COVID-19 at a [facility] urgent care on 8/30. Patient with worsening cough resulting in increased SOB therefore called 911 with O2 at 84% on room air.    PT Comments    Patient progressing well with acute PT. She continues to be motivated but requires seated rest breaks due to fatigue. Patient instructed today on ambulating with portable O2 tank and demonstrated safe management to keep tank ahead of herself. She desaturated to a low of 84% and maintain sats around 87-89% during gait on 3L/min. Pt sats improved with acapella and incentive spirometer exercises. Patient encouraged to complete functional LE strengthening while RN/NT is present in room to maintain functional strength. Acute PT will follow and progress activity tolerance, anticipate pt will not need follow up after discharge.     Follow Up Recommendations  No PT follow up     Equipment Recommendations  None recommended by PT    Recommendations for Other Services       Precautions / Restrictions Precautions Precautions: None Restrictions Weight Bearing Restrictions: No    Mobility  Bed Mobility Overal bed mobility: Independent;Modified Independent             General bed mobility comments: HOB elevated, pt taking some increased time.  Transfers Overall transfer level: Needs assistance Equipment used: None Transfers: Sit to/from Stand Sit to Stand: Supervision         General transfer comment: supervision for line safety.  Ambulation/Gait Ambulation/Gait assistance: Supervision;Min guard Gait Distance (Feet): 100 Feet Assistive device: None (O2 portable canister) Gait  Pattern/deviations: Step-through pattern;Decreased stride length Gait velocity: decr   General Gait Details: pt with no overt LOB. Educated on use of O2 portable tank and use for gait. Pt pushed tank in front and pulled behind, more comfortable pushign the tank ahead of herself. Pt on 3L/min thruoghout mobility. SpO2 dropped to 87-89% throughout and improved to 90-91% with use of accapella in seated rest.    Stairs             Wheelchair Mobility    Modified Rankin (Stroke Patients Only)       Balance Overall balance assessment: Mild deficits observed, not formally tested                 Cognition Arousal/Alertness: Awake/alert Behavior During Therapy: WFL for tasks assessed/performed Overall Cognitive Status: Within Functional Limits for tasks assessed             Exercises General Exercises - Lower Extremity Long Arc Quad: AROM;Both;10 reps;Seated Low Level/ICU Exercises Ankle Circles/Pumps: AROM;Both;10 reps;Seated Other Exercises Other Exercises: Acapella 2x 10 reps (between gait) Other Exercises: 1x5 reps acapella, 1x5 reps incentive spirometer.    General Comments        Pertinent Vitals/Pain Pain Assessment: No/denies pain           PT Goals (current goals can now be found in the care plan section) Acute Rehab PT Goals Patient Stated Goal: feel better PT Goal Formulation: With patient Time For Goal Achievement: 06/27/20 Potential to Achieve Goals: Good Progress towards PT goals: Progressing toward goals    Frequency    Min 3X/week      PT Plan Current plan  remains appropriate       AM-PAC PT "6 Clicks" Mobility   Outcome Measure  Help needed turning from your back to your side while in a flat bed without using bedrails?: None Help needed moving from lying on your back to sitting on the side of a flat bed without using bedrails?: None Help needed moving to and from a bed to a chair (including a wheelchair)?: A Little Help  needed standing up from a chair using your arms (e.g., wheelchair or bedside chair)?: None Help needed to walk in hospital room?: A Little Help needed climbing 3-5 steps with a railing? : A Little 6 Click Score: 21    End of Session Equipment Utilized During Treatment: Oxygen Activity Tolerance: Patient limited by fatigue;Patient tolerated treatment well Patient left: in bed;with call bell/phone within reach Nurse Communication: Mobility status PT Visit Diagnosis: Difficulty in walking, not elsewhere classified (R26.2);Muscle weakness (generalized) (M62.81)     Time: 2440-1027 PT Time Calculation (min) (ACUTE ONLY): 24 min  Charges:  $Gait Training: 8-22 mins $Therapeutic Exercise: 8-22 mins                     Wynn Maudlin, DPT Acute Rehabilitation Services  Office [phone] Pager [phone]  06/15/2020 3:28 PM

## 2020-06-16 LAB — CULTURE, BLOOD (ROUTINE X 2)
Culture: NO GROWTH
Culture: NO GROWTH
Special Requests: ADEQUATE

## 2020-06-16 LAB — COMPREHENSIVE METABOLIC PANEL
ALT: 145 U/L — ABNORMAL HIGH (ref 0–44)
AST: 35 U/L (ref 15–41)
Albumin: 3.2 g/dL — ABNORMAL LOW (ref 3.5–5.0)
Alkaline Phosphatase: 57 U/L (ref 38–126)
Anion gap: 7 (ref 5–15)
BUN: 22 mg/dL — ABNORMAL HIGH (ref 6–20)
CO2: 23 mmol/L (ref 22–32)
Calcium: 8.6 mg/dL — ABNORMAL LOW (ref 8.9–10.3)
Chloride: 106 mmol/L (ref 98–111)
Creatinine, Ser: 0.6 mg/dL (ref 0.44–1.00)
GFR calc Af Amer: >60 mL/min (ref >60)
GFR calc non Af Amer: >60 mL/min (ref >60)
Glucose, Bld: 200 mg/dL — ABNORMAL HIGH (ref 70–99)
Potassium: 4.8 mmol/L (ref 3.5–5.1)
Sodium: 136 mmol/L (ref 135–145)
Total Bilirubin: 0.5 mg/dL (ref 0.3–1.2)
Total Protein: 6 g/dL — ABNORMAL LOW (ref 6.5–8.1)

## 2020-06-16 LAB — CBC WITH DIFFERENTIAL/PLATELET
Abs Immature Granulocytes: 0.35 10*3/uL — ABNORMAL HIGH (ref 0.00–0.07)
Basophils Absolute: 0 10*3/uL (ref 0.0–0.1)
Basophils Relative: 0 %
Eosinophils Absolute: 0 10*3/uL (ref 0.0–0.5)
Eosinophils Relative: 0 %
HCT: 43 % (ref 36.0–46.0)
Hemoglobin: 13.9 g/dL (ref 12.0–15.0)
Immature Granulocytes: 4 %
Lymphocytes Relative: 7 %
Lymphs Abs: 0.6 10*3/uL — ABNORMAL LOW (ref 0.7–4.0)
MCH: 29.2 pg (ref 26.0–34.0)
MCHC: 32.3 g/dL (ref 30.0–36.0)
MCV: 90.3 fL (ref 80.0–100.0)
Monocytes Absolute: 0.7 10*3/uL (ref 0.1–1.0)
Monocytes Relative: 9 %
Neutro Abs: 6.3 10*3/uL (ref 1.7–7.7)
Neutrophils Relative %: 80 %
Platelets: 331 10*3/uL (ref 150–400)
RBC: 4.76 MIL/uL (ref 3.87–5.11)
RDW: 12.2 % (ref 11.5–15.5)
WBC: 7.9 10*3/uL (ref 4.0–10.5)
nRBC: 0 % (ref 0.0–0.2)

## 2020-06-16 LAB — FERRITIN: Ferritin: 549 ng/mL — ABNORMAL HIGH (ref 11–307)

## 2020-06-16 LAB — GLUCOSE, CAPILLARY
Glucose-Capillary: 148 mg/dL — ABNORMAL HIGH (ref 70–99)
Glucose-Capillary: 154 mg/dL — ABNORMAL HIGH (ref 70–99)

## 2020-06-16 LAB — C-REACTIVE PROTEIN: CRP: 0.5 mg/dL (ref <1.0)

## 2020-06-16 LAB — MAGNESIUM: Magnesium: 2.7 mg/dL — ABNORMAL HIGH (ref 1.7–2.4)

## 2020-06-16 LAB — D-DIMER, QUANTITATIVE: D-Dimer, Quant: 0.54 ug/mL-FEU — ABNORMAL HIGH (ref 0.00–0.50)

## 2020-06-16 MED ORDER — PREDNISONE 10 MG PO TABS: ORAL_TABLET | ORAL | 0 refills | Status: AC

## 2020-06-16 MED ORDER — PREDNISONE 10 MG PO TABS: ORAL_TABLET | ORAL | 0 refills | Status: DC

## 2020-06-16 MED ORDER — ZINC SULFATE 220 (50 ZN) MG PO TABS: 220.0000 mg | ORAL_TABLET | Freq: Every day | ORAL | 0 refills | Status: AC

## 2020-06-16 MED ORDER — VITAMIN C 250 MG PO TABS: 500.0000 mg | ORAL_TABLET | Freq: Every day | ORAL | 0 refills | Status: AC

## 2020-06-16 MED ORDER — ALBUTEROL SULFATE HFA 108 (90 BASE) MCG/ACT IN AERS
2.0000 | INHALATION_SPRAY | RESPIRATORY_TRACT | Status: DC
  Administered 2020-06-16 (×3): 2 via RESPIRATORY_TRACT

## 2020-06-16 NOTE — Discharge Instructions (Signed)
COVID-19 Frequently Asked Questions COVID-19 (coronavirus disease) is an infection that is caused by a large family of viruses. Some viruses cause illness in people and others cause illness in animals like camels, cats, and bats. In some cases, the viruses that cause illness in animals can spread to humans. Where did the coronavirus come from? In December 2019, [place] told the World Health Organization (WHO) of several cases of lung disease (human respiratory illness). These cases were linked to an open seafood and livestock market in the city of [place]. The link to the seafood and livestock market suggests that the virus may have spread from animals to humans. However, since that first outbreak in December, the virus has also been shown to spread from person to person. What is the name of the disease and the virus? Disease name Early on, this disease was called novel coronavirus. This is because scientists determined that the disease was caused by a new (novel) respiratory virus. The World Health Organization (WHO) has now named the disease COVID-19, or coronavirus disease. Virus name The virus that causes the disease is called severe acute respiratory syndrome coronavirus 2 (SARS-CoV-2). More information on disease and virus naming World Health Organization (WHO): www.who.int/emergencies/diseases/novel-coronavirus-2019/technical-guidance/naming-the-coronavirus-disease-(covid-2019)-and-the-virus-that-causes-it Who is at risk for complications from coronavirus disease? Some people may be at higher risk for complications from coronavirus disease. This includes older adults and people who have chronic diseases, such as heart disease, diabetes, and lung disease. If you are at higher risk for complications, take these extra precautions:  Stay home as much as possible.  Avoid social gatherings and travel.  Avoid close contact with others. Stay at least 6 ft (2 m) away from others, if possible.  Wash  your hands often with soap and water for at least 20 seconds.  Avoid touching your face, mouth, nose, or eyes.  Keep supplies on hand at home, such as food, medicine, and cleaning supplies.  If you must go out in public, wear a cloth face covering or face mask. Make sure your mask covers your nose and mouth. How does coronavirus disease spread? The virus that causes coronavirus disease spreads easily from person to person (is contagious). You may catch the virus by:  Breathing in droplets from an infected person. Droplets can be spread by a person breathing, speaking, singing, coughing, or sneezing.  Touching something, like a table or a doorknob, that was exposed to the virus (contaminated) and then touching your mouth, nose, or eyes. Can I get the virus from touching surfaces or objects? There is still a lot that we do not know about the virus that causes coronavirus disease. Scientists are basing a lot of information on what they know about similar viruses, such as:  Viruses cannot generally survive on surfaces for long. They need a human body (host) to survive.  It is more likely that the virus is spread by close contact with people who are sick (direct contact), such as through: ? Shaking hands or hugging. ? Breathing in respiratory droplets that travel through the air. Droplets can be spread by a person breathing, speaking, singing, coughing, or sneezing.  It is less likely that the virus is spread when a person touches a surface or object that has the virus on it (indirect contact). The virus may be able to enter the body if the person touches a surface or object and then touches his or her face, eyes, nose, or mouth. Can a person spread the virus without having symptoms of the disease?   It may be possible for the virus to spread before a person has symptoms of the disease, but this is most likely not the main way the virus is spreading. It is more likely for the virus to spread by  being in close contact with people who are sick and breathing in the respiratory droplets spread by a person breathing, speaking, singing, coughing, or sneezing. What are the symptoms of coronavirus disease? Symptoms vary from person to person and can range from mild to severe. Symptoms may include:  Fever or chills.  Cough.  Difficulty breathing or feeling short of breath.  Headaches, body aches, or muscle aches.  Runny or stuffy (congested) nose.  Sore throat.  New loss of taste or smell.  Nausea, vomiting, or diarrhea. These symptoms can appear anywhere from 2 to 14 days after you have been exposed to the virus. Some people may not have any symptoms. If you develop symptoms, call your health care provider. People with severe symptoms may need hospital care. Should I be tested for this virus? Your health care provider will decide whether to test you based on your symptoms, history of exposure, and your risk factors. How does a health care provider test for this virus? Health care providers will collect samples to send for testing. Samples may include:  Taking a swab of fluid from the back of your nose and throat, your nose, or your throat.  Taking fluid from the lungs by having you cough up mucus (sputum) into a sterile cup.  Taking a blood sample. Is there a treatment or vaccine for this virus? Currently, there is no vaccine to prevent coronavirus disease. Also, there are no medicines like antibiotics or antivirals to treat the virus. A person who becomes sick is given supportive care, which means rest and fluids. A person may also relieve his or her symptoms by using over-the-counter medicines that treat sneezing, coughing, and runny nose. These are the same medicines that a person takes for the common cold. If you develop symptoms, call your health care provider. People with severe symptoms may need hospital care. What can I do to protect myself and my family from this  virus?     You can protect yourself and your family by taking the same actions that you would take to prevent the spread of other viruses. Take the following actions:  Wash your hands often with soap and water for at least 20 seconds. If soap and water are not available, use alcohol-based hand sanitizer.  Avoid touching your face, mouth, nose, or eyes.  Cough or sneeze into a tissue, sleeve, or elbow. Do not cough or sneeze into your hand or the air. ? If you cough or sneeze into a tissue, throw it away immediately and wash your hands.  Disinfect objects and surfaces that you frequently touch every day.  Stay away from people who are sick.  Avoid going out in public, follow guidance from your state and local health authorities.  Avoid crowded indoor spaces. Stay at least 6 ft (2 m) away from others.  If you must go out in public, wear a cloth face covering or face mask. Make sure your mask covers your nose and mouth.  Stay home if you are sick, except to get medical care. Call your health care provider before you get medical care. Your health care provider will tell you how long to stay home.  Make sure your vaccines are up to date. Ask your health care provider what   vaccines you need. What should I do if I need to travel? Follow travel recommendations from your local health authority, the CDC, and WHO. Travel information and advice  Centers for Disease Control and Prevention (CDC): www.cdc.gov/coronavirus/2019-ncov/travelers/index.html  World Health Organization (WHO): www.who.int/emergencies/diseases/novel-coronavirus-2019/travel-advice Know the risks and take action to protect your health  You are at higher risk of getting coronavirus disease if you are traveling to areas with an outbreak or if you are exposed to travelers from areas with an outbreak.  Wash your hands often and practice good hygiene to lower the risk of catching or spreading the virus. What should I do if I  am sick? General instructions to stop the spread of infection  Wash your hands often with soap and water for at least 20 seconds. If soap and water are not available, use alcohol-based hand sanitizer.  Cough or sneeze into a tissue, sleeve, or elbow. Do not cough or sneeze into your hand or the air.  If you cough or sneeze into a tissue, throw it away immediately and wash your hands.  Stay home unless you must get medical care. Call your health care provider or local health authority before you get medical care.  Avoid public areas. Do not take public transportation, if possible.  If you can, wear a mask if you must go out of the house or if you are in close contact with someone who is not sick. Make sure your mask covers your nose and mouth. Keep your home clean  Disinfect objects and surfaces that are frequently touched every day. This may include: ? Counters and tables. ? Doorknobs and light switches. ? Sinks and faucets. ? Electronics such as phones, remote controls, keyboards, computers, and tablets.  Wash dishes in hot, soapy water or use a dishwasher. Air-dry your dishes.  Wash laundry in hot water. Prevent infecting other household members  Let healthy household members care for children and pets, if possible. If you have to care for children or pets, wash your hands often and wear a mask.  Sleep in a different bedroom or bed, if possible.  Do not share personal items, such as razors, toothbrushes, deodorant, combs, brushes, towels, and washcloths. Where to find more information Centers for Disease Control and Prevention (CDC)  Information and news updates: www.cdc.gov/coronavirus/2019-ncov World Health Organization (WHO)  Information and news updates: www.who.int/emergencies/diseases/novel-coronavirus-2019  Coronavirus health topic: www.who.int/health-topics/coronavirus  Questions and answers on COVID-19: www.who.int/news-room/q-a-detail/q-a-coronaviruses  Global  tracker: who.sprinklr.com American Academy of Pediatrics (AAP)  Information for families: www.healthychildren.org/English/health-issues/conditions/chest-lungs/Pages/2019-Novel-Coronavirus.aspx The coronavirus situation is changing rapidly. Check your local health authority website or the CDC and WHO websites for updates and news. When should I contact a health care provider?  Contact your health care provider if you have symptoms of an infection, such as fever or cough, and you: ? Have been near anyone who is known to have coronavirus disease. ? Have come into contact with a person who is suspected to have coronavirus disease. ? Have traveled to an area where there is an outbreak of COVID-19. When should I get emergency medical care?  Get help right away by calling your local emergency services (911 in the [place] if you have: ? Trouble breathing. ? Pain or pressure in your chest. ? Confusion. ? Blue-tinged lips and fingernails. ? Difficulty waking from sleep. ? Symptoms that get worse. Let the emergency medical personnel know if you think you have coronavirus disease. Summary  A new respiratory virus is spreading from person to person and causing   COVID-19 (coronavirus disease).  The virus that causes COVID-19 appears to spread easily. It spreads from one person to another through droplets from breathing, speaking, singing, coughing, or sneezing.  Older adults and those with chronic diseases are at higher risk of disease. If you are at higher risk for complications, take extra precautions.  There is currently no vaccine to prevent coronavirus disease. There are no medicines, such as antibiotics or antivirals, to treat the virus.  You can protect yourself and your family by washing your hands often, avoiding touching your face, and covering your coughs and sneezes. This information is not intended to replace advice given to you by your health care provider. Make sure you discuss any  questions you have with your health care provider. Document Revised: 07/26/2019 Document Reviewed: 01/22/2019 Elsevier Patient Education  [place] Can Do to Manage Your COVID-19 Symptoms at Home If you have possible or confirmed COVID-19: 1. Stay home from work and school. And stay away from other public places. If you must go out, avoid using any kind of public transportation, ridesharing, or taxis. 2. Monitor your symptoms carefully. If your symptoms get worse, call your healthcare provider immediately. 3. Get rest and stay hydrated. 4. If you have a medical appointment, call the healthcare provider ahead of time and tell them that you have or may have COVID-19. 5. For medical emergencies, call 911 and notify the dispatch personnel that you have or may have COVID-19. 6. Cover your cough and sneezes with a tissue or use the inside of your elbow. 7. Wash your hands often with soap and water for at least 20 seconds or clean your hands with an alcohol-based hand sanitizer that contains at least 60% alcohol. 8. As much as possible, stay in a specific room and away from other people in your home. Also, you should use a separate bathroom, if available. If you need to be around other people in or outside of the home, wear a mask. 9. Avoid sharing personal items with other people in your household, like dishes, towels, and bedding. 10. Clean all surfaces that are touched often, like counters, tabletops, and doorknobs. Use household cleaning sprays or wipes according to the label instructions. [email or web address] 04/10/2019 This information is not intended to replace advice given to you by your health care provider. Make sure you discuss any questions you have with your health care provider. Document Revised: 09/12/2019 Document Reviewed: 09/12/2019 Elsevier Patient Education  [place].  COVID-19: How to Protect Yourself and Others Know how it spreads  There is  currently no vaccine to prevent coronavirus disease 2019 (COVID-19).  The best way to prevent illness is to avoid being exposed to this virus.  The virus is thought to spread mainly from person-to-person. ? Between people who are in close contact with one another (within about 6 feet). ? Through respiratory droplets produced when an infected person coughs, sneezes or talks. ? These droplets can land in the mouths or noses of people who are nearby or possibly be inhaled into the lungs. ? COVID-19 may be spread by people who are not showing symptoms. Everyone should Clean your hands often  Wash your hands often with soap and water for at least 20 seconds especially after you have been in a public place, or after blowing your nose, coughing, or sneezing.  If soap and water are not readily available, use a hand sanitizer that contains at least 60% alcohol. Cover all surfaces of  your hands and rub them together until they feel dry.  Avoid touching your eyes, nose, and mouth with unwashed hands. Avoid close contact  Limit contact with others as much as possible.  Avoid close contact with people who are sick.  Put distance between yourself and other people. ? Remember that some people without symptoms may be able to spread virus. ? This is especially important for people who are at higher risk of getting very GainPain.[email or web address] Cover your mouth and nose with a mask when around others  You could spread COVID-19 to others even if you do not feel sick.  Everyone should wear a mask in public settings and when around people not living in their household, especially when social distancing is difficult to maintain. ? Masks should not be placed on young children under age 54, anyone who has trouble breathing, or is unconscious, incapacitated or otherwise unable to remove the mask without assistance.  The mask is meant to protect  other people in case you are infected.  Do NOT use a facemask meant for a Dietitian.  Continue to keep about 6 feet between yourself and others. The mask is not a substitute for social distancing. Cover coughs and sneezes  Always cover your mouth and nose with a tissue when you cough or sneeze or use the inside of your elbow.  Throw used tissues in the trash.  Immediately wash your hands with soap and water for at least 20 seconds. If soap and water are not readily available, clean your hands with a hand sanitizer that contains at least 60% alcohol. Clean and disinfect  Clean AND disinfect frequently touched surfaces daily. This includes tables, doorknobs, light switches, countertops, handles, desks, phones, keyboards, toilets, faucets, and sinks. [email or web address]  If surfaces are dirty, clean them: Use detergent or soap and water prior to disinfection.  Then, use a household disinfectant. You can see a list of EPA-registered household disinfectants here. [email or web address] 06/12/2019 This information is not intended to replace advice given to you by your health care provider. Make sure you discuss any questions you have with your health care provider. Document Revised: 06/20/2019 Document Reviewed: 04/18/2019 Elsevier Patient Education  2020 [employer].   COVID-19 COVID-19 is a respiratory infection that is caused by a virus called severe acute respiratory syndrome coronavirus 2 (SARS-CoV-2). The disease is also known as coronavirus disease or novel coronavirus. In some people, the virus may not cause any symptoms. In others, it may cause a serious infection. The infection can get worse quickly and can lead to complications, such as:  Pneumonia, or infection of the lungs.  Acute respiratory distress syndrome or ARDS. This is a condition in which fluid build-up in the lungs prevents the lungs from filling  with air and passing oxygen into the blood.  Acute respiratory failure. This is a condition in which there is not enough oxygen passing from the lungs to the body or when carbon dioxide is not passing from the lungs out of the body.  Sepsis or septic shock. This is a serious bodily reaction to an infection.  Blood clotting problems.  Secondary infections due to bacteria or fungus.  Organ failure. This is when your body's organs stop working. The virus that causes COVID-19 is contagious. This means that it can spread from person to person through droplets from coughs and sneezes (respiratory secretions). What are the causes? This illness is caused by a virus. You may catch the virus by:  Breathing in droplets from  an infected person. Droplets can be spread by a person breathing, speaking, singing, coughing, or sneezing.  Touching something, like a table or a doorknob, that was exposed to the virus (contaminated) and then touching your mouth, nose, or eyes. What increases the risk? Risk for infection You are more likely to be infected with this virus if you:  Are within 6 feet (2 meters) of a person with COVID-19.  Provide care for or live with a person who is infected with COVID-19.  Spend time in crowded indoor spaces or live in shared housing. Risk for serious illness You are more likely to become seriously ill from the virus if you:  Are 29 years of age or older. The higher your age, the more you are at risk for serious illness.  Live in a nursing home or long-term care facility.  Have cancer.  Have a long-term (chronic) disease such as: ? Chronic lung disease, including chronic obstructive pulmonary disease or asthma. ? A long-term disease that lowers your body's ability to fight infection (immunocompromised). ? Heart disease, including heart failure, a condition in which the arteries that lead to the heart become narrow or blocked (coronary artery disease), a disease which  makes the heart muscle thick, weak, or stiff (cardiomyopathy). ? Diabetes. ? Chronic kidney disease. ? Sickle cell disease, a condition in which red blood cells have an abnormal "sickle" shape. ? Liver disease.  Are obese. What are the signs or symptoms? Symptoms of this condition can range from mild to severe. Symptoms may appear any time from 2 to 14 days after being exposed to the virus. They include:  A fever or chills.  A cough.  Difficulty breathing.  Headaches, body aches, or muscle aches.  Runny or stuffy (congested) nose.  A sore throat.  New loss of taste or smell. Some people may also have stomach problems, such as nausea, vomiting, or diarrhea. Other people may not have any symptoms of COVID-19. How is this diagnosed? This condition may be diagnosed based on:  Your signs and symptoms, especially if: ? You live in an area with a COVID-19 outbreak. ? You recently traveled to or from an area where the virus is common. ? You provide care for or live with a person who was diagnosed with COVID-19. ? You were exposed to a person who was diagnosed with COVID-19.  A physical exam.  Lab tests, which may include: ? Taking a sample of fluid from the back of your nose and throat (nasopharyngeal fluid), your nose, or your throat using a swab. ? A sample of mucus from your lungs (sputum). ? Blood tests.  Imaging tests, which may include, X-rays, CT scan, or ultrasound. How is this treated? At present, there is no medicine to treat COVID-19. Medicines that treat other diseases are being used on a trial basis to see if they are effective against COVID-19. Your health care provider will talk with you about ways to treat your symptoms. For most people, the infection is mild and can be managed at home with rest, fluids, and over-the-counter medicines. Treatment for a serious infection usually takes places in a hospital intensive care unit (ICU). It may include one or more of the  following treatments. These treatments are given until your symptoms improve.  Receiving fluids and medicines through an IV.  Supplemental oxygen. Extra oxygen is given through a tube in the nose, a face mask, or a hood.  Positioning you to lie on your stomach (prone position). This  makes it easier for oxygen to get into the lungs.  Continuous positive airway pressure (CPAP) or bi-level positive airway pressure (BPAP) machine. This treatment uses mild air pressure to keep the airways open. A tube that is connected to a motor delivers oxygen to the body.  Ventilator. This treatment moves air into and out of the lungs by using a tube that is placed in your windpipe.  Tracheostomy. This is a procedure to create a hole in the neck so that a breathing tube can be inserted.  Extracorporeal membrane oxygenation (ECMO). This procedure gives the lungs a chance to recover by taking over the functions of the heart and lungs. It supplies oxygen to the body and removes carbon dioxide. Follow these instructions at home: Lifestyle  If you are sick, stay home except to get medical care. Your health care provider will tell you how long to stay home. Call your health care provider before you go for medical care.  Rest at home as told by your health care provider.  Do not use any products that contain nicotine or tobacco, such as cigarettes, e-cigarettes, and chewing tobacco. If you need help quitting, ask your health care provider.  Return to your normal activities as told by your health care provider. Ask your health care provider what activities are safe for you. General instructions  Take over-the-counter and prescription medicines only as told by your health care provider.  Drink enough fluid to keep your urine pale yellow.  Keep all follow-up visits as told by your health care provider. This is important. How is this prevented?  There is no vaccine to help prevent COVID-19 infection. However,  there are steps you can take to protect yourself and others from this virus. To protect yourself:   Do not travel to areas where COVID-19 is a risk. The areas where COVID-19 is reported change often. To identify high-risk areas and travel restrictions, check the CDC travel website: FatFares.com.br  If you live in, or must travel to, an area where COVID-19 is a risk, take precautions to avoid infection. ? Stay away from people who are sick. ? Wash your hands often with soap and water for 20 seconds. If soap and water are not available, use an alcohol-based hand sanitizer. ? Avoid touching your mouth, face, eyes, or nose. ? Avoid going out in public, follow guidance from your state and local health authorities. ? If you must go out in public, wear a cloth face covering or face mask. Make sure your mask covers your nose and mouth. ? Avoid crowded indoor spaces. Stay at least 6 feet (2 meters) away from others. ? Disinfect objects and surfaces that are frequently touched every day. This may include:  Counters and tables.  Doorknobs and light switches.  Sinks and faucets.  Electronics, such as phones, remote controls, keyboards, computers, and tablets. To protect others: If you have symptoms of COVID-19, take steps to prevent the virus from spreading to others.  If you think you have a COVID-19 infection, contact your health care provider right away. Tell your health care team that you think you may have a COVID-19 infection.  Stay home. Leave your house only to seek medical care. Do not use public transport.  Do not travel while you are sick.  Wash your hands often with soap and water for 20 seconds. If soap and water are not available, use alcohol-based hand sanitizer.  Stay away from other members of your household. Let healthy household members care  for children and pets, if possible. If you have to care for children or pets, wash your hands often and wear a mask. If  possible, stay in your own room, separate from others. Use a different bathroom.  Make sure that all people in your household wash their hands well and often.  Cough or sneeze into a tissue or your sleeve or elbow. Do not cough or sneeze into your hand or into the air.  Wear a cloth face covering or face mask. Make sure your mask covers your nose and mouth. Where to find more information  Centers for Disease Control and Prevention: StickerEmporium.tn  World Health Organization: https://thompson-craig.com/ Contact a health care provider if:  You live in or have traveled to an area where COVID-19 is a risk and you have symptoms of the infection.  You have had contact with someone who has COVID-19 and you have symptoms of the infection. Get help right away if:  You have trouble breathing.  You have pain or pressure in your chest.  You have confusion.  You have bluish lips and fingernails.  You have difficulty waking from sleep.  You have symptoms that get worse. These symptoms may represent a serious problem that is an emergency. Do not wait to see if the symptoms will go away. Get medical help right away. Call your local emergency services (911 in the [place] Do not drive yourself to the hospital. Let the emergency medical personnel know if you think you have COVID-19. Summary  COVID-19 is a respiratory infection that is caused by a virus. It is also known as coronavirus disease or novel coronavirus. It can cause serious infections, such as pneumonia, acute respiratory distress syndrome, acute respiratory failure, or sepsis.  The virus that causes COVID-19 is contagious. This means that it can spread from person to person through droplets from breathing, speaking, singing, coughing, or sneezing.  You are more likely to develop a serious illness if you are 58 years of age or older, have a weak immune system, live in a nursing home, or have chronic  disease.  There is no medicine to treat COVID-19. Your health care provider will talk with you about ways to treat your symptoms.  Take steps to protect yourself and others from infection. Wash your hands often and disinfect objects and surfaces that are frequently touched every day. Stay away from people who are sick and wear a mask if you are sick. This information is not intended to replace advice given to you by your health care provider. Make sure you discuss any questions you have with your health care provider. Document Revised: 07/26/2019 Document Reviewed: 11/01/2018 Elsevier Patient Education  2020 Elsevier Inc.    Person Under Monitoring Name: Amber Baker  Location: [address] [place] [address]   CORONAVIRUS DISEASE 2019 (COVID-19) Guidance for Persons Under Investigation You are being tested for the virus that causes coronavirus disease 2019 (COVID-19). Public health actions are necessary to ensure protection of your health and the health of others, and to prevent further spread of infection. COVID-19 is caused by a virus that can cause symptoms, such as fever, cough, and shortness of breath. The primary transmission from person to person is by coughing or sneezing. On November 08, 2018, the World Health Organization announced a [employer] Emergency of International Concern and on November 09, 2018 the [place].S. Department of Health and Human Services declared a public health emergency. If the virus that causesCOVID-19 spreads in the  community, it could have severe public health consequences.  As a person under investigation for COVID-19, the [employer] of Health and [employer], Division of [employer] advises you to adhere to the following guidance until your test results are reported to you. If your test result is positive, you will receive additional information from your provider and your local health department at that time.   Remain at home  until you are cleared by your health provider or public health authorities.   Keep a log of visitors to your home using the form provided. Any visitors to your home must be aware of your isolation status.  If you plan to move to a new address or leave the county, notify the local health department in your county.  Call a doctor or seek care if you have an urgent medical need. Before seeking medical care, call ahead and get instructions from the provider before arriving at the medical office, clinic or hospital. Notify them that you are being tested for the virus that causes COVID-19 so arrangements can be made, as necessary, to prevent transmission to others in the healthcare setting. Next, notify the local health department in your county.  If a medical emergency arises and you need to call 911, inform the first responders that you are being tested for the virus that causes COVID-19. Next, notify the local health department in your county.  Adhere to all guidance set forth by the [facility] of [employer] for [facility] of patients that is based on guidance from the Center for Disease Control and Prevention with suspected or confirmed COVID-19. It is provided with this guidance for Persons Under Investigation.  Your health and the health of our community are our top priorities. Public Health officials remain available to provide assistance and counseling to you about COVID-19 and compliance with this guidance.  Provider: ____________________________________________________________ Date: ______/_____/_________  By signing below, you acknowledge that you have read and agree to comply with this Guidance for Persons Under Investigation. ______________________________________________________________ Date: ______/_____/_________  WHO DO I CALL? You can find a list of local health departments here: [email or web address]/ Health Department:  ____________________________________________________________________ Contact Name: ________________________________________________________________________ Telephone: ___________________________________________________________________________  Nedra Hai, Division of Public Health, Communicable Disease Branch COVID-19 Guidance for Persons Under Investigation December 15, 2018

## 2020-06-16 NOTE — Discharge Summary (Addendum)
Physician Discharge Summary  Amber LERETTE VWU:[id number] DOB: 1966-07-02 DOA: 06/11/2020  PCP: Emi Belfast, FNP  Admit date: 06/11/2020 Discharge date: 06/16/2020  Admitted From: Home Disposition: Home  Recommendations for Outpatient Follow-up:  1. Follow up with PCP in 1-2 weeks 2. Continue prednisone taper on discharge 3. Continue supportive care with the same, vitamin C, albuterol MDI as needed 4. Discharging on home oxygen, 4 L per nasal cannula 5. Recommend discussion regarding obtaining Covid-19 vaccination  Home Health: No Equipment/Devices: Oxygen, 4 L per nasal cannula  Discharge Condition: Stable CODE STATUS: Full code Diet recommendation: Heart healthy diet  History of present illness:  Amber Baker is a 54 year old female with past medical history of hypertension who presented to the ED with worsening shortness of breath and hypoxemia after recently being diagnosed with COVID-19 at a [facility] urgent care on 8/30. Reportedly her symptoms began on 8/23 and had worsened prompting her to initially seek care at an urgent care where she was prescribed prednisone and doxycycline. She states she only took about 2 days worth of these medications so far. Reports that her cough has worsened and resulted in significant shortness of breath this a.m. prompting her to call 911. EMS found patient to be high toxemic at 84% on room air and she was titrated up to 4 L/min nasal cannula and was not given any additional treatment in route. States that she has had fever up to 101.8Fat home, some change in taste, diarrhea, cough is intermittently productive. Was at a family funeral about 2 weeks ago which seems to have been a super spreader event for the family. She has not been vaccinated against COVID-19  ED Course:Vitals: T 98.4, P 93, respirations up to 40s at bedside, BP 132/86, SPO2 93% on 4 L/min O2. Notable labs: AST 70, ALT 79, LDH 297, ferritin 748, CRP 2.5, lactic acid 1.3, WBC  7.4, Hb 16.2, D-dimer 0.53, fibrinogen 550. Beta hCG 6 (negative is <5).CXR with peripheral predominant airspace opacities in the L>R lung bases concerning for pneumonia given clinical history. She was started on remdesivir.  Hospital course:  Acute hypoxic respiratory failure secondary to acute Covid-19 viral pneumonia during the ongoing 2020 Covid 19 Pandemic - POA Patient presenting to ED after progressive shortness of breath.  Recent Covid-19 test positive on 06/08/2020.  Initial presentation she was found to be hypoxic with SPO2 84% on room air.  Fever up to 101.8.  Patient is unvaccinated. COVID test: + PCR 8/30 from [facility].  Patient was started on remdesivir and completed a 5-day course.  Patient also was treated with baricitinib and completed 6 days of therapy.  Patient initially was started on IV steroids which was slowly tapered down during the hospitalization with improvement of her inflammatory markers.  Patient will continue prednisone taper on discharge.  Patient continued to require supplemental oxygen during hospitalization which was slowly titrated down to 4 L at time of discharge.  Continue home isolation for 14 days from initial date of diagnosis.  Recommend follow-up with PCP and further precautions regarding obtaining Covid-19 vaccination.  The treatment plan and use of medications and known side effects were discussed with patient/family. Some of the medications used are based on case reports/anecdotal data.  All other medications being used in the management of COVID-19 based on limited study data.  Complete risks and long-term side effects are unknown, however in the best clinical judgment they seem to be of some benefit.  Patient wanted to proceed with treatment  options provided.  Essential hypertension Continue home ramipril 5 mg p.o. daily   Discharge Diagnoses:  Principal Problem:   Acute hypoxemic respiratory failure due to COVID-19 [facility](HCC) Active Problems:   Essential  hypertension, benign   Abnormal transaminases    Discharge Instructions  Discharge Instructions    Call MD for:  difficulty breathing, headache or visual disturbances   Complete by: As directed    Call MD for:  extreme fatigue   Complete by: As directed    Call MD for:  persistant dizziness or light-headedness   Complete by: As directed    Call MD for:  persistant nausea and vomiting   Complete by: As directed    Call MD for:  severe uncontrolled pain   Complete by: As directed    Call MD for:  temperature >100.4   Complete by: As directed    Diet - low sodium heart healthy   Complete by: As directed    Increase activity slowly   Complete by: As directed      Allergies as of 06/16/2020      Reactions   Penicillins Other (See Comments)   REACTION: unknown Childhood      Medication List    TAKE these medications   albuterol 108 (90 Base) MCG/ACT inhaler Commonly known as: VENTOLIN HFA Inhale 2 puffs into the lungs every 4 (four) hours.   BC HEADACHE POWDER PO Take 1 application by mouth daily as needed (Headache).   benzonatate 200 MG capsule Commonly known as: TESSALON Take 200 mg by mouth in the morning, at noon, and at bedtime.   BOSWELLIA PO Take 1 tablet by mouth daily.   DHEA 10 MG Tabs Take 10 mg by mouth daily.   doxycycline 100 MG capsule Commonly known as: VIBRAMYCIN Take 100 mg by mouth 2 (two) times daily.   fexofenadine 180 MG tablet Commonly known as: ALLEGRA Take 180 mg by mouth daily as needed for allergies.   fluticasone 50 MCG/ACT nasal spray Commonly known as: FLONASE Place 1 spray into both nostrils.   ibuprofen 200 MG tablet Commonly known as: ADVIL Take 200 mg by mouth every 6 (six) hours as needed for headache or moderate pain.   IRON PO Take 325 mg by mouth daily.   meloxicam 15 MG tablet Commonly known as: MOBIC Take 1 tablet (15 mg total) by mouth daily.   multivitamin tablet Take 1 tablet by mouth daily.   naproxen  sodium 220 MG tablet Commonly known as: ALEVE Take 220 mg by mouth daily as needed (headache).   predniSONE 20 MG tablet Commonly known as: DELTASONE Take 20 mg by mouth at bedtime. What changed: Another medication with the same name was added. Make sure you understand how and when to take each.   predniSONE 10 MG tablet Commonly known as: DELTASONE Take 5 tablets (50 mg total) by mouth daily for 3 days, THEN 4 tablets (40 mg total) daily for 3 days, THEN 3 tablets (30 mg total) daily for 3 days, THEN 2 tablets (20 mg total) daily for 3 days, THEN 1 tablet (10 mg total) daily for 3 days. Then resume home 20mg  daily. Start taking on: June 17, 2020 What changed: You were already taking a medication with the same name, and this prescription was added. Make sure you understand how and when to take each.   PROBIOTIC DAILY PO Take 1 tablet by mouth daily.   QC TUMERIC COMPLEX PO Take 1 tablet by mouth daily.  ramipril 5 MG capsule Commonly known as: ALTACE Take 1 capsule (5 mg total) by mouth daily.   SACCHAROMYCES BOULARDII PO Take 1 tablet by mouth daily.   SUMAtriptan 50 MG tablet Commonly known as: IMITREX TAKE 1 TABLET BY MOUTH AS NEEDED FOR MIGRAINE. MAY REPEAT IN 2 HOURS IF NEEDED. MAX OF 2 TABLETS IN 24 HOURS. What changed: See the new instructions.   SUPER ENZYMES PO Take 1 tablet by mouth daily.   vitamin C 250 MG tablet Commonly known as: ASCORBIC ACID Take 2 tablets (500 mg total) by mouth daily.   Vitamin D-3 125 MCG (5000 UT) Tabs Take 5,000 Units by mouth daily.   Zinc Sulfate 220 (50 Zn) MG Tabs Take 1 tablet (220 mg total) by mouth daily at 6 (six) AM.            Durable Medical Equipment  (From admission, onward)         Start     Ordered   06/15/20 1154  For home use only DME oxygen  Once       Question Answer Comment  Length of Need 6 Months   Mode or (Route) Nasal cannula   Liters per Minute 4   Frequency Continuous (stationary and  portable oxygen unit needed)   Oxygen conserving device Yes   Oxygen delivery system Gas      06/15/20 1153          Follow-up Information    Emi Belfast, FNP. Schedule an appointment as soon as possible for a visit in 1 week(s).   Specialties: Nurse Practitioner, Family Medicine Contact information: [address] [place] [address] [phone]              Allergies  Allergen Reactions  . Penicillins Other (See Comments)    REACTION: unknown Childhood    Consultations:  None   Procedures/Studies: DG Chest Port 1 View  Result Date: 06/11/2020 CLINICAL DATA:  COVID positive with cough. EXAM: PORTABLE CHEST 1 VIEW COMPARISON:  None. FINDINGS: Areas of peripheral predominant airspace opacities within the left greater than right lung bases. There are some areas of linear opacity within bilateral lung bases. No pleural effusions. No pneumothorax. Cardiac silhouette is within normal limits. No acute osseous abnormality. IMPRESSION: Areas of peripheral predominant airspace opacities within the left greater than right lung bases, concerning for pneumonia given the clinical history. Linear scarring/atelectasis is also suspected. Electronically Signed   By: Feliberto Harts MD   On: 06/11/2020 12:42     Subjective: Patient seen and examined bedside, resting comfortably.  States ready for discharge home.  Continues on supplemental oxygen, now titrated down to 4 L per nasal cannula.  Denies headache, no fever/chills/night sweats, no nausea/vomiting/diarrhea, no dizziness, no chest pain, no palpitations, no abdominal pain.  No acute events overnight per nursing staff.  Discharge Exam: Vitals:   06/15/20 2148 06/16/20 0533  BP: (!) 147/91 125/87  Pulse: 62 (!) 59  Resp: 19 19  Temp: 97.6 F (36.4 C) 97.8 F (36.6 C)  SpO2: 95% 92%   Vitals:   06/15/20 0523 06/15/20 1341 06/15/20 2148 06/16/20 0533  BP: (!) 133/91 134/90 (!) 147/91 125/87  Pulse: (!) 59 69  62 (!) 59  Resp: 18 19 19 19   Temp: 98.4 F (36.9 C) 98.1 F (36.7 C) 97.6 F (36.4 C) 97.8 F (36.6 C)  TempSrc: Oral Oral Oral Oral  SpO2: 91% 93% 95% 92%  Weight:  Height:        General: Pt is alert, awake, not in acute distress Cardiovascular: RRR, S1/S2 +, no rubs, no gallops Respiratory: CTA bilaterally, no wheezing, no rhonchi, on 4 L nasal cannula with SPO2 92% at rest Abdominal: Soft, NT, ND, bowel sounds + Extremities: no edema, no cyanosis    The results of significant diagnostics from this hospitalization (including imaging, microbiology, ancillary and laboratory) are listed below for reference.     Microbiology: Recent Results (from the past 240 hour(s))  Blood Culture (routine x 2)     Status: None   Collection Time: 06/11/20 12:30 PM   Specimen: BLOOD  Result Value Ref Range Status   Specimen Description   Final    BLOOD RIGHT ANTECUBITAL Performed at [facility], [address], [place], [place] [address]    Special Requests   Final    BOTTLES DRAWN AEROBIC AND ANAEROBIC Blood Culture adequate volume Performed at [facility], [address], [place], [place] [address]    Culture   Final    NO GROWTH 5 DAYS Performed at [facility], [address], [place], [place] [address]    Report Status 06/16/2020 FINAL  Final  Blood Culture (routine x 2)     Status: None   Collection Time: 06/11/20 12:40 PM   Specimen: BLOOD LEFT FOREARM  Result Value Ref Range Status   Specimen Description   Final    BLOOD LEFT FOREARM Performed at [facility], [address], [place], [place] [address]    Special Requests   Final    BOTTLES DRAWN AEROBIC ONLY Blood Culture results may not be optimal due to an inadequate volume of blood received in culture bottles Performed at [facility], [address], [place], [place] [address]    Culture   Final    NO GROWTH 5 DAYS Performed  at [facility], [address] [place], [place] [address]    Report Status 06/16/2020 FINAL  Final     Labs: BNP (last 3 results) No results for input(s): BNP in the last 8760 hours. Basic Metabolic Panel: Recent Labs  Lab 06/12/20 0357 06/13/20 0407 06/14/20 0526 06/15/20 0512 06/16/20 0433  NA 138 139 140 140 136  K 4.4 4.2 4.3 4.6 4.8  CL 103 105 105 105 106  CO2 25 25 25 24 23   GLUCOSE 179* 184* 186* 201* 200*  BUN 19 24* 24* 23* 22*  CREATININE 0.70 0.66 0.61 0.64 0.60  CALCIUM 8.7* 9.1 9.0 9.1 8.6*  MG 2.2 2.4 2.5* 2.6* 2.7*   Liver Function Tests: Recent Labs  Lab 06/12/20 0357 06/13/20 0407 06/14/20 0526 06/15/20 0512 06/16/20 0433  AST 73* 53* 109* 70* 35  ALT 89* 96* 175* 196* 145*  ALKPHOS 67 66 61 62 57  BILITOT 0.6 0.5 0.6 0.5 0.5  PROT 6.7 6.9 6.7 6.3* 6.0*  ALBUMIN 3.6 3.6 3.5 3.5 3.2*   No results for input(s): LIPASE, AMYLASE in the last 168 hours. No results for input(s): AMMONIA in the last 168 hours. CBC: Recent Labs  Lab 06/12/20 0357 06/13/20 0407 06/14/20 0526 06/15/20 0512 06/16/20 0433  WBC 4.2 3.5* 6.6 6.8 7.9  NEUTROABS 3.5 2.3 5.1 5.6 6.3  HGB 14.4 14.5 14.0 14.0 13.9  HCT 44.2 43.8 42.4 42.3 43.0  MCV 89.7 88.8 88.9 88.7 90.3  PLT 221 308 346 332 331   Cardiac Enzymes: No results for input(s):  CKTOTAL, CKMB, CKMBINDEX, TROPONINI in the last 168 hours. BNP: Invalid input(s): POCBNP CBG: Recent Labs  Lab 06/15/20 1117 06/15/20 1722 06/15/20 2147 06/16/20 0805 06/16/20 1145  GLUCAP 250* 159* 190* 148* 154*   D-Dimer Recent Labs    06/15/20 0512 06/16/20 0433  DDIMER 0.38 0.54*   Hgb A1c No results for input(s): HGBA1C in the last 72 hours. Lipid Profile No results for input(s): CHOL, HDL, LDLCALC, TRIG, CHOLHDL, LDLDIRECT in the last 72 hours. Thyroid function studies No results for input(s): TSH, T4TOTAL, T3FREE, THYROIDAB in the last 72 hours.  Invalid input(s): FREET3 Anemia work up Recent  Labs    06/15/20 0512 06/16/20 0433  FERRITIN 632* 549*   Urinalysis    Component Value Date/Time   COLORURINE YELLOW 05/25/2009 0409   APPEARANCEUR CLEAR 05/25/2009 0409   LABSPEC 1.005 05/25/2009 0409   PHURINE 6.5 05/25/2009 0409   GLUCOSEU NEGATIVE 05/25/2009 0409   HGBUR NEGATIVE 05/25/2009 0409   BILIRUBINUR negative 04/16/2012 0829   KETONESUR NEGATIVE 05/25/2009 0409   PROTEINUR trace 04/16/2012 0829   PROTEINUR NEGATIVE 05/25/2009 0409   UROBILINOGEN negative 04/16/2012 0829   UROBILINOGEN 0.2 05/25/2009 0409   NITRITE negative 04/16/2012 0829   NITRITE NEGATIVE 05/25/2009 0409   LEUKOCYTESUR Negative 04/16/2012 0829   Sepsis Labs Invalid input(s): PROCALCITONIN,  WBC,  LACTICIDVEN Microbiology Recent Results (from the past 240 hour(s))  Blood Culture (routine x 2)     Status: None   Collection Time: 06/11/20 12:30 PM   Specimen: BLOOD  Result Value Ref Range Status   Specimen Description   Final    BLOOD RIGHT ANTECUBITAL Performed at [facility], [address], [place], [place] [address]    Special Requests   Final    BOTTLES DRAWN AEROBIC AND ANAEROBIC Blood Culture adequate volume Performed at [facility], [address], [place], [place] [address]    Culture   Final    NO GROWTH 5 DAYS Performed at [facility], [address] [place], [place] [address]    Report Status 06/16/2020 FINAL  Final  Blood Culture (routine x 2)     Status: None   Collection Time: 06/11/20 12:40 PM   Specimen: BLOOD LEFT FOREARM  Result Value Ref Range Status   Specimen Description   Final    BLOOD LEFT FOREARM Performed at [facility], [address], [place], [place] [address]    Special Requests   Final    BOTTLES DRAWN AEROBIC ONLY Blood Culture results may not be optimal due to an inadequate volume of blood received in culture bottles Performed at [facility], [address] [place], [place] [address]    Culture   Final    NO GROWTH 5 DAYS Performed at [facility], [address], [place], [place] [address]    Report Status 06/16/2020 FINAL  Final     Time coordinating discharge: Over 30 minutes  SIGNED:   Alvira Philips [place], DO  [facility] 06/16/2020, 12:51 PM

## 2020-06-16 NOTE — TOC Transition Note (Signed)
Transition of Care [facility]) - CM/SW Discharge Note   Patient Details  Name: Amber Baker MRN: [id number] Date of Birth: 22-Jan-1966  Transition of Care [facility]) CM/SW Contact:  Ida Rogue, LCSW Phone Number: 06/16/2020, 11:01 AM   Clinical Narrative:   Patient who is stable for d/c today is in need of home O2, has ride home.  SAT note and orders seen and appreciated.  Contacted Zach with [employer] who will supply a cannister for ride home, coordinate with patient and family for delivery of home O2. No further needs identified.  TOC sign off.    Final next level of care: Home/Self Care Barriers to Discharge: No Barriers Identified   Patient Goals and CMS Choice        Discharge Placement                       Discharge Plan and Services                                     Social Determinants of Health (SDOH) Interventions     Readmission Risk Interventions No flowsheet data found.

## 2020-07-06 ENCOUNTER — Ambulatory Visit: Payer: BC Managed Care – PPO | Admitting: Family Medicine

## 2020-07-10 ENCOUNTER — Other Ambulatory Visit: Payer: Self-pay

## 2020-07-10 ENCOUNTER — Encounter: Payer: Self-pay | Admitting: Family Medicine

## 2020-07-10 ENCOUNTER — Ambulatory Visit (INDEPENDENT_AMBULATORY_CARE_PROVIDER_SITE_OTHER): Payer: BC Managed Care – PPO | Admitting: Family Medicine

## 2020-07-10 VITALS — BP 140/90 | HR 89 | Temp 97.4°F | Ht 63.5 in | Wt 177.0 lb

## 2020-07-10 DIAGNOSIS — E6609 Other obesity due to excess calories: Secondary | ICD-10-CM | POA: Diagnosis not present

## 2020-07-10 DIAGNOSIS — Z6830 Body mass index (BMI) 30.0-30.9, adult: Secondary | ICD-10-CM

## 2020-07-10 DIAGNOSIS — Z8616 Personal history of COVID-19: Secondary | ICD-10-CM | POA: Diagnosis not present

## 2020-07-10 DIAGNOSIS — Z91018 Allergy to other foods: Secondary | ICD-10-CM

## 2020-07-10 DIAGNOSIS — R7303 Prediabetes: Secondary | ICD-10-CM

## 2020-07-10 DIAGNOSIS — Z9981 Dependence on supplemental oxygen: Secondary | ICD-10-CM

## 2020-07-10 NOTE — Progress Notes (Signed)
   Subjective:    Patient ID: Amber Baker, female    DOB: May 11, 1966, 54 y.o.   MRN: [id number]  HPI  Chief Complaint  Patient presents with  . Hospitalization Follow-up    positive for covid   This is a 54 yo female who presents today for follow up of Covid. Was admitted 9/2-7/21 with acute hypoxic respiratory failure. Sent home with supplemental oxygen. Has been using at night, 2.5 liters. Occasional wheeze, using albuterol 2x/ day as needed, not every day.  Causes her to have some shakiness. No fever. Lost sense of taste, not smell.  Energy level is improving.  She is able to perform her ADLs without difficulty.  She is planning to go back to work in the middle of the month. She is not planning to get COVID-19 vaccine.  She has been going to [facility].  She has had extensive blood work done which showed allergy to casein, eggs.  She requests referral to dietitian to help with food selections and weight loss.  Review of Systems Per HPI    Objective:   Physical Exam Physical Exam  Constitutional: Oriented to person, place, and time. Appears well-developed and well-nourished.  HENT:  Head: Normocephalic and atraumatic.  Eyes: Conjunctivae are normal.  Neck: Normal range of motion. Neck supple.  Cardiovascular: Normal rate, regular rhythm and normal heart sounds.   Pulmonary/Chest: Effort normal and breath sounds normal.  Musculoskeletal: No lower extremity edema.   Neurological: Alert and oriented to person, place, and time.  Skin: Skin is warm and dry.  Psychiatric: Normal mood and affect. Behavior is normal. Judgment and thought content normal.  Vitals reviewed.     BP 140/90   Pulse 89   Temp (!) 97.4 F (36.3 C) (Temporal)   Ht 5' 3.5" (1.613 m)   Wt 177 lb (80.3 kg)   LMP 03/24/2014   SpO2 97%   BMI 30.86 kg/m  Wt Readings from Last 3 Encounters:  07/10/20 177 lb (80.3 kg)  06/14/20 188 lb 15 oz (85.7 kg)  05/18/20 188 lb (85.3 kg)         Assessment & Plan:  1. Prediabetes -Has had some weight loss with illness, encouraged her to work on healthy food choices, referral to dietitian placed - Referral to [facility]  2. Multiple food allergies - Referral to [facility]  3. History of COVID-19 -Improving.  Discussed COVID-19 vaccination, acquired immunity with illness.  Encouraged her to consider getting influenza vaccine as she will be at high risk of complication from upper respiratory infections.  She declined today.  4. Class 1 obesity due to excess calories without serious comorbidity with body mass index (BMI) of 30.0 to 30.9 in adult - Referral to [facility] Diabetes [facility]  5. On home oxygen therapy -She is currently using only at night.  She has a home pulse oximeter for monitoring.  Discussed continuing to wean as able  -Follow-up in 3 months This visit occurred during the SARS-CoV-2 public health emergency.  Safety protocols were in place, including screening questions prior to the visit, additional usage of staff PPE, and extensive cleaning of exam room while observing appropriate contact time as indicated for disinfecting solutions.      Olean Ree, FNP-BC  [facility], [facility]  07/12/2020 9:51 AM

## 2020-07-12 ENCOUNTER — Encounter: Payer: Self-pay | Admitting: Family Medicine

## 2020-07-12 DIAGNOSIS — Z8616 Personal history of COVID-19: Secondary | ICD-10-CM | POA: Insufficient documentation

## 2020-07-12 DIAGNOSIS — R7303 Prediabetes: Secondary | ICD-10-CM | POA: Insufficient documentation

## 2020-08-13 ENCOUNTER — Telehealth: Payer: Self-pay | Admitting: Family Medicine

## 2020-08-13 NOTE — Telephone Encounter (Signed)
called and left vm for patient to call to reschedule cpe appt 2/14 due to pcp schedule change. NO Details left. EM 08/13/20

## 2020-08-19 ENCOUNTER — Encounter: Payer: Self-pay | Admitting: Family Medicine

## 2020-08-19 ENCOUNTER — Telehealth: Payer: Self-pay | Admitting: *Deleted

## 2020-08-19 NOTE — Telephone Encounter (Signed)
Community message sent to [facility] regarding DC of oxygen and picking up equipment.  MyChart message sent to patient.

## 2020-08-19 NOTE — Telephone Encounter (Signed)
Patient left a voicemail stating that she was sent home from the hospital in September with oxygen. Patient stated that she is ready to have the oxygen picked up. Patient stated that she has requested [facility] to pick the oxygen up but they have told her that they need an order from Deboraha Sprang NP to pick the oxygen equipment up. Patient stated that the order needs to be faxed to [facility] [phone]

## 2020-09-01 ENCOUNTER — Other Ambulatory Visit: Payer: Self-pay | Admitting: Family Medicine

## 2020-09-01 DIAGNOSIS — G43709 Chronic migraine without aura, not intractable, without status migrainosus: Secondary | ICD-10-CM

## 2020-09-01 NOTE — Telephone Encounter (Signed)
Pharmacy requests refill on: Sumatriptan Succinate 50 mg  LAST REFILL: 12/17/2019 LAST OV: 07/10/2020 NEXT OV: Not Scheduled  PHARMACY: [facility]   B/P for last two office visits: 140/90 & 144/88

## 2020-11-16 ENCOUNTER — Other Ambulatory Visit: Payer: BC Managed Care – PPO

## 2020-11-23 ENCOUNTER — Encounter: Payer: BC Managed Care – PPO | Admitting: Family Medicine

## 2020-12-07 ENCOUNTER — Other Ambulatory Visit: Payer: Self-pay

## 2020-12-07 ENCOUNTER — Encounter: Payer: Self-pay | Admitting: Family Medicine

## 2020-12-07 ENCOUNTER — Ambulatory Visit: Payer: BC Managed Care – PPO | Admitting: Family Medicine

## 2020-12-07 VITALS — BP 126/84 | HR 79 | Temp 97.0°F | Ht 63.5 in | Wt 185.2 lb

## 2020-12-07 DIAGNOSIS — G43709 Chronic migraine without aura, not intractable, without status migrainosus: Secondary | ICD-10-CM | POA: Diagnosis not present

## 2020-12-07 DIAGNOSIS — I10 Essential (primary) hypertension: Secondary | ICD-10-CM

## 2020-12-07 DIAGNOSIS — R7303 Prediabetes: Secondary | ICD-10-CM

## 2020-12-07 DIAGNOSIS — R5383 Other fatigue: Secondary | ICD-10-CM | POA: Diagnosis not present

## 2020-12-07 NOTE — Assessment & Plan Note (Signed)
Some degree of this since covid 19 in the fall  Past h/o B12 shot -? What for  B12 level added to labs

## 2020-12-07 NOTE — Assessment & Plan Note (Signed)
Pt uses prn imitrex  Taking magnesium and this has helped  Also goes to integrative tx clinic

## 2020-12-07 NOTE — Progress Notes (Signed)
Subjective:    Patient ID: Amber Baker, female    DOB: 03-06-1966, 55 y.o.   MRN: [id number]  This visit occurred during the SARS-CoV-2 public health emergency.  Safety protocols were in place, including screening questions prior to the visit, additional usage of staff PPE, and extensive cleaning of exam room while observing appropriate contact time as indicated for disinfecting solutions.    HPI 55 yo pt of NP Leone Payor presents for transfer of care   Wt Readings from Last 3 Encounters:  12/07/20 185 lb 3 oz (84 kg)  07/10/20 177 lb (80.3 kg)  06/14/20 188 lb 15 oz (85.7 kg)   32.29 kg/m   HTN bp is stable today  No cp or palpitations or headaches or edema  No side effects to medicines  BP Readings from Last 3 Encounters:  12/07/20 126/84  07/10/20 140/90  06/16/20 125/87    Takes altace 5 mg daily    H/o migraines  Has imitrex for rescue  H/o covid 19 illness in sept with hospitalization Declines vaccine  Breathing feels "funny" once in a while and still feels tired   Takes vit D as well as other supplements from her integrative clinics   Takes mvi  Had B12 shot in 2017  Takes magnesium 3 pills daily   (no diarrhea)   Family hx  Mother had kidney cancer since last visit    Lab Results  Component Value Date   CREATININE 0.60 06/16/2020   BUN 22 (H) 06/16/2020   NA 136 06/16/2020   K 4.8 06/16/2020   CL 106 06/16/2020   CO2 23 06/16/2020   Lab Results  Component Value Date   CHOL 164 11/11/2019   HDL 51.70 11/11/2019   LDLCALC 97 11/11/2019   TRIG 133 06/11/2020   CHOLHDL 3 11/11/2019    Prediabetes Likes sugar a lot and loves to bake  Lab Results  Component Value Date   HGBA1C 6.1 11/11/2019    Also multiple food allergies Goes to [facility] in [place] Avoids gluten and dairy and eggs   Working on that  Wants to do better with it  Feels better when she avoids those foods  Cheese is a [employer]   Exercise - likes to walk  /has a dog twice a day    H/o elevated transaminases Lab Results  Component Value Date   ALT 145 (H) 06/16/2020   AST 35 06/16/2020   ALKPHOS 57 06/16/2020   BILITOT 0.5 06/16/2020   Patient Active Problem List   Diagnosis Date Noted  . Prediabetes 07/12/2020  . History of COVID-19 07/12/2020  . Abnormal transaminases 06/11/2020  . Fatigue 08/29/2016  . Fecal incontinence 07/25/2016  . 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 Encounter for routine gynecological examination 05/23/2016  . Allergic rhinitis 02/14/2013  . Migraine headache 11/27/2007  . Essential hypertension, benign 08/10/2007   Past Medical History:  Diagnosis Date  . Abdominal pain, generalized   . Abnormal weight gain   . Acute upper respiratory infections of unspecified site   . Allergy   . Arthritis    Feet - diagnosed by Podiatrist  . Essential hypertension, benign   . Gastroenteritis   . Migraine, unspecified, without mention of intractable migraine without mention of status migrainosus   . Routine general medical examination at a health care facility    Past Surgical History:  Procedure Laterality Date  . CHOLECYSTECTOMY    . OVARY SURGERY Left  Social History   Tobacco Use  . Smoking status: Never Smoker  . Smokeless tobacco: Never Used  Vaping Use  . Vaping Use: Never used  Substance Use Topics  . Alcohol use: No    Alcohol/week: 0.0 standard drinks  . Drug use: No   Family History  Problem Relation Age of Onset  . Kidney cancer Mother   . Colon cancer Neg Hx   . Esophageal cancer Neg Hx   . Rectal cancer Neg Hx   . Stomach cancer Neg Hx    Allergies  Allergen Reactions  . Penicillins Other (See Comments)    REACTION: unknown Childhood   Current Outpatient Medications on File Prior to Visit  Medication Sig Dispense Refill  . albuterol (VENTOLIN HFA) 108 (90 Base) MCG/ACT inhaler Inhale 2 puffs into the lungs every 4 (four) hours.    . Aspirin-Salicylamide-Caffeine (BC  HEADACHE POWDER PO) Take 1 application by mouth daily as needed (Headache).     . Cholecalciferol (VITAMIN D-3) 125 MCG (5000 UT) TABS Take 5,000 Units by mouth daily.    Marland Kitchen DHEA 10 MG TABS Take 10 mg by mouth daily.     . Digestive Enzymes (SUPER ENZYMES PO) Take 1 tablet by mouth daily.    . fexofenadine (ALLEGRA) 180 MG tablet Take 180 mg by mouth daily as needed for allergies.     Marland Kitchen ibuprofen (ADVIL,MOTRIN) 200 MG tablet Take 200 mg by mouth every 6 (six) hours as needed for headache or moderate pain.     . meloxicam (MOBIC) 15 MG tablet Take 1 tablet (15 mg total) by mouth daily. 30 tablet 3  . naproxen sodium (ALEVE) 220 MG tablet Take 220 mg by mouth daily as needed (headache).     . Probiotic Product (PROBIOTIC DAILY PO) Take 1 tablet by mouth daily.     . ramipril (ALTACE) 5 MG capsule Take 1 capsule (5 mg total) by mouth daily. 90 capsule 3  . SUMAtriptan (IMITREX) 50 MG tablet TAKE 1 TABLET BY MOUTH AS NEEDED FOR MIGRAINE. MAY REPEAT IN 2 HOURS IF NEEDED. MAX OF 2 TABLETS IN 24 HOURS. 9 tablet 2   No current facility-administered medications on file prior to visit.    Review of Systems  Constitutional: Positive for fatigue. Negative for activity change, appetite change, fever and unexpected weight change.  HENT: Negative for congestion, ear pain, rhinorrhea, sinus pressure and sore throat.   Eyes: Negative for pain, redness and visual disturbance.  Respiratory: Negative for cough, shortness of breath and wheezing.   Cardiovascular: Negative for chest pain and palpitations.  Gastrointestinal: Negative for abdominal pain, blood in stool, constipation and diarrhea.  Endocrine: Negative for polydipsia and polyuria.  Genitourinary: Negative for dysuria, frequency and urgency.  Musculoskeletal: Negative for arthralgias, back pain and myalgias.  Skin: Negative for pallor and rash.  Allergic/Immunologic: Negative for environmental allergies.  Neurological: Negative for dizziness, syncope  and headaches.  Hematological: Negative for adenopathy. Does not bruise/bleed easily.  Psychiatric/Behavioral: Negative for decreased concentration and dysphoric mood. The patient is not nervous/anxious.        Objective:   Physical Exam Constitutional:      General: She is not in acute distress.    Appearance: Normal appearance. She is well-developed and well-nourished. She is obese.  HENT:     Head: Normocephalic and atraumatic.     Mouth/Throat:     Mouth: Oropharynx is clear and moist.  Eyes:     Extraocular Movements: EOM normal.  Conjunctiva/sclera: Conjunctivae normal.     Pupils: Pupils are equal, round, and reactive to light.  Neck:     Thyroid: No thyromegaly.     Vascular: No carotid bruit or JVD.  Cardiovascular:     Rate and Rhythm: Normal rate and regular rhythm.     Pulses: Intact distal pulses.     Heart sounds: Normal heart sounds. No gallop.   Pulmonary:     Effort: Pulmonary effort is normal. No respiratory distress.     Breath sounds: Normal breath sounds. No wheezing or rales.     Comments: No crackles Abdominal:     General: Abdomen is flat. There is no abdominal bruit.  Musculoskeletal:        General: No edema.     Cervical back: Normal range of motion and neck supple.     Right lower leg: No edema.     Left lower leg: No edema.  Lymphadenopathy:     Cervical: No cervical adenopathy.  Skin:    General: Skin is warm and dry.     Coloration: Skin is not pale.     Findings: No rash.  Neurological:     Mental Status: She is alert.     Coordination: Coordination normal.     Deep Tendon Reflexes: Reflexes are normal and symmetric. Reflexes normal.  Psychiatric:        Mood and Affect: Mood and affect and mood normal.           Assessment & Plan:   Problem List Items Addressed This Visit      Cardiovascular and Mediastinum   Migraine headache    Pt uses prn imitrex  Taking magnesium and this has helped  Also goes to integrative tx  clinic      Essential hypertension, benign - Primary    bp in fair control at this time  BP Readings from Last 1 Encounters:  12/07/20 126/84   No changes needed Most recent labs reviewed  Disc lifstyle change with low sodium diet and exercise  Plan to continue altace 5 mg daily  Lab today      Relevant Orders   CBC with Differential/Platelet   Comprehensive metabolic panel   TSH   Lipid panel     Other   Fatigue    Some degree of this since covid 19 in the fall  Past h/o B12 shot -? What for  B12 level added to labs      Relevant Orders   Vitamin B12   Prediabetes    A1c today  disc imp of low glycemic diet and wt loss to prevent DM2       Relevant Orders   Hemoglobin A1c

## 2020-12-07 NOTE — Patient Instructions (Addendum)
Try to get most of your carbohydrates from produce (with the exception of white potatoes)  Eat less bread/pasta/rice/snack foods/cereals/sweets and other items from the middle of the grocery store (processed carbs)  Take care of yourself   Labs today  You can cancel your upcoming lab appt  See you soon

## 2020-12-07 NOTE — Assessment & Plan Note (Signed)
A1c today disc imp of low glycemic diet and wt loss to prevent DM2  

## 2020-12-07 NOTE — Assessment & Plan Note (Signed)
bp in fair control at this time  BP Readings from Last 1 Encounters:  12/07/20 126/84   No changes needed Most recent labs reviewed  Disc lifstyle change with low sodium diet and exercise  Plan to continue altace 5 mg daily  Lab today

## 2020-12-08 LAB — COMPREHENSIVE METABOLIC PANEL
ALT: 18 U/L (ref 0–35)
AST: 19 U/L (ref 0–37)
Albumin: 4.4 g/dL (ref 3.5–5.2)
Alkaline Phosphatase: 91 U/L (ref 39–117)
BUN: 13 mg/dL (ref 6–23)
CO2: 28 mEq/L (ref 19–32)
Calcium: 9.6 mg/dL (ref 8.4–10.5)
Chloride: 104 mEq/L (ref 96–112)
Creatinine, Ser: 0.82 mg/dL (ref 0.40–1.20)
GFR: 81.24 mL/min (ref >60.00)
Glucose, Bld: 102 mg/dL — ABNORMAL HIGH (ref 70–99)
Potassium: 4.3 mEq/L (ref 3.5–5.1)
Sodium: 139 mEq/L (ref 135–145)
Total Bilirubin: 0.3 mg/dL (ref 0.2–1.2)
Total Protein: 7 g/dL (ref 6.0–8.3)

## 2020-12-08 LAB — CBC WITH DIFFERENTIAL/PLATELET
Basophils Absolute: 0.1 10*3/uL (ref 0.0–0.1)
Basophils Relative: 1.2 % (ref 0.0–3.0)
Eosinophils Absolute: 0.1 10*3/uL (ref 0.0–0.7)
Eosinophils Relative: 1.6 % (ref 0.0–5.0)
HCT: 41.5 % (ref 36.0–46.0)
Hemoglobin: 14 g/dL (ref 12.0–15.0)
Lymphocytes Relative: 32.6 % (ref 12.0–46.0)
Lymphs Abs: 1.7 10*3/uL (ref 0.7–4.0)
MCHC: 33.8 g/dL (ref 30.0–36.0)
MCV: 87 fl (ref 78.0–100.0)
Monocytes Absolute: 0.7 10*3/uL (ref 0.1–1.0)
Monocytes Relative: 12.6 % — ABNORMAL HIGH (ref 3.0–12.0)
Neutro Abs: 2.7 10*3/uL (ref 1.4–7.7)
Neutrophils Relative %: 52 % (ref 43.0–77.0)
Platelets: 328 10*3/uL (ref 150.0–400.0)
RBC: 4.77 Mil/uL (ref 3.87–5.11)
RDW: 13.5 % (ref 11.5–15.5)
WBC: 5.2 10*3/uL (ref 4.0–10.5)

## 2020-12-08 LAB — LDL CHOLESTEROL, DIRECT: Direct LDL: 102 mg/dL

## 2020-12-08 LAB — LIPID PANEL
Cholesterol: 169 mg/dL (ref 0–200)
HDL: 46 mg/dL (ref >39.00)
NonHDL: 123.48
Total CHOL/HDL Ratio: 4
Triglycerides: 203 mg/dL — ABNORMAL HIGH (ref 0.0–149.0)
VLDL: 40.6 mg/dL — ABNORMAL HIGH (ref 0.0–40.0)

## 2020-12-08 LAB — TSH: TSH: 0.68 u[IU]/mL (ref 0.35–4.50)

## 2020-12-08 LAB — HEMOGLOBIN A1C: Hgb A1c MFr Bld: 6 % (ref 4.6–6.5)

## 2020-12-08 LAB — VITAMIN B12: Vitamin B-12: 699 pg/mL (ref 211–911)

## 2020-12-19 ENCOUNTER — Telehealth: Payer: Self-pay | Admitting: Family Medicine

## 2020-12-19 DIAGNOSIS — R7303 Prediabetes: Secondary | ICD-10-CM

## 2020-12-19 DIAGNOSIS — I10 Essential (primary) hypertension: Secondary | ICD-10-CM

## 2020-12-19 NOTE — Telephone Encounter (Signed)
-----   Message from Alvina Chou sent at 12/07/2020 10:37 AM EST ----- Regarding: Lab orders for Monday, 3.14.22 Patient is scheduled for CPX labs, please order future labs, Thanks , Camelia Eng

## 2020-12-21 ENCOUNTER — Other Ambulatory Visit: Payer: BC Managed Care – PPO

## 2020-12-28 ENCOUNTER — Encounter: Payer: BC Managed Care – PPO | Admitting: Family Medicine

## 2021-01-19 ENCOUNTER — Encounter: Payer: Self-pay | Admitting: Family Medicine

## 2021-01-19 ENCOUNTER — Other Ambulatory Visit: Payer: Self-pay

## 2021-01-19 ENCOUNTER — Ambulatory Visit (INDEPENDENT_AMBULATORY_CARE_PROVIDER_SITE_OTHER): Payer: BC Managed Care – PPO | Admitting: Family Medicine

## 2021-01-19 VITALS — BP 132/80 | HR 80 | Temp 96.9°F | Ht 63.5 in | Wt 185.1 lb

## 2021-01-19 DIAGNOSIS — Z0000 Encounter for general adult medical examination without abnormal findings: Secondary | ICD-10-CM | POA: Diagnosis not present

## 2021-01-19 DIAGNOSIS — R7303 Prediabetes: Secondary | ICD-10-CM | POA: Diagnosis not present

## 2021-01-19 DIAGNOSIS — I10 Essential (primary) hypertension: Secondary | ICD-10-CM

## 2021-01-19 DIAGNOSIS — G43709 Chronic migraine without aura, not intractable, without status migrainosus: Secondary | ICD-10-CM | POA: Diagnosis not present

## 2021-01-19 MED ORDER — SUMATRIPTAN SUCCINATE 50 MG PO TABS: ORAL_TABLET | ORAL | 11 refills | Status: DC

## 2021-01-19 NOTE — Patient Instructions (Addendum)
Try to get most of your carbohydrates from produce (with the exception of white potatoes)  Eat less bread/pasta/rice/snack foods/cereals/sweets and other items from the middle of the grocery store (processed carbs)  Keep walking   For cholesterol  Avoid red meat/ fried foods/ egg yolks/ fatty breakfast meats/ butter, cheese and high fat dairy/ and shellfish    Don't forget to schedule your mammogram

## 2021-01-19 NOTE — Progress Notes (Signed)
Subjective:    Patient ID: Amber Baker, female    DOB: 07-09-1966, 55 y.o.   MRN: [id number]  This visit occurred during the SARS-CoV-2 public health emergency.  Safety protocols were in place, including screening questions prior to the visit, additional usage of staff PPE, and extensive cleaning of exam room while observing appropriate contact time as indicated for disinfecting solutions.    HPI Here for health maintenance exam and to review chronic medical problems    Wt Readings from Last 3 Encounters:  01/19/21 185 lb 2 oz (84 kg)  12/07/20 185 lb 3 oz (84 kg)  07/10/20 177 lb (80.3 kg)   32.28 kg/m   Has been feeling pretty good  Taking care of herself  Getting outside  Exercise- likes to walk - twice daily - with her dog   covid status - had it in the fall  Declines imm    Zoster status -not interested in shingles vaccine    Tdap 1/19 Declines flu shots   Mammogram 3/21- got a reminder  Self breast exam -no lumps  L lateral breast was uncomfortable last week (? Muscle) and then fine now   Pap 2/21-nl wit neg HPV No gyn symptoms No need for std testing   Colonoscopy 11/19 with 10 y recall   HTN bp is stable today  No cp or palpitations or headaches or edema  No side effects to medicines  BP Readings from Last 3 Encounters:  01/19/21 132/80  12/07/20 126/84  07/10/20 140/90    Took med late today -right before she got here (unusual for her )  Takes altace 5 mg daily   Lab Results  Component Value Date   CREATININE 0.82 12/07/2020   BUN 13 12/07/2020   NA 139 12/07/2020   K 4.3 12/07/2020   CL 104 12/07/2020   CO2 28 12/07/2020   Lab Results  Component Value Date   ALT 18 12/07/2020   AST 19 12/07/2020   ALKPHOS 91 12/07/2020   BILITOT 0.3 12/07/2020   Prediabetes Lab Results  Component Value Date   HGBA1C 6.0 12/07/2020  fasting glucose 102   Cholesterol  Lab Results  Component Value Date   CHOL 169 12/07/2020   CHOL 164  11/11/2019   CHOL 164 06/18/2018   Lab Results  Component Value Date   HDL 46.00 12/07/2020   HDL 51.70 11/11/2019   HDL 50.50 06/18/2018   Lab Results  Component Value Date   LDLCALC 97 11/11/2019   LDLCALC 96 06/18/2018   LDLCALC 101 (H) 05/23/2016   Lab Results  Component Value Date   TRIG 203.0 (H) 12/07/2020   TRIG 133 06/11/2020   TRIG 75.0 11/11/2019   Lab Results  Component Value Date   CHOLHDL 4 12/07/2020   CHOLHDL 3 11/11/2019   CHOLHDL 3 06/18/2018   Lab Results  Component Value Date   LDLDIRECT 102.0 12/07/2020   Will walk more in the spring HDL down a few pt   Vit B12 Lab Results  Component Value Date   VITAMINB12 699 12/07/2020   Was fatigued at last visit   Lab Results  Component Value Date   WBC 5.2 12/07/2020   HGB 14.0 12/07/2020   HCT 41.5 12/07/2020   MCV 87.0 12/07/2020   PLT 328.0 12/07/2020   Lab Results  Component Value Date   TSH 0.68 12/07/2020    occ takes nsaid for arthritis in her foot  occ for headache   Needs  imitrex refill   Patient Active Problem List   Diagnosis Date Noted  . Prediabetes 07/12/2020  . History of COVID-19 07/12/2020  . Fatigue 08/29/2016  . Fecal incontinence 07/25/2016  . 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 Encounter for routine gynecological examination 05/23/2016  . Routine general medical examination at a health care facility 02/14/2013  . Allergic rhinitis 02/14/2013  . Migraine headache 11/27/2007  . Essential hypertension, benign 08/10/2007   Past Medical History:  Diagnosis Date  . Abdominal pain, generalized   . Abnormal weight gain   . Acute upper respiratory infections of unspecified site   . Allergy   . Arthritis    Feet - diagnosed by Podiatrist  . Essential hypertension, benign   . Gastroenteritis   . Migraine, unspecified, without mention of intractable migraine without mention of status migrainosus   . Routine general medical examination at a health care facility     Past Surgical History:  Procedure Laterality Date  . CHOLECYSTECTOMY    . OVARY SURGERY Left    Social History   Tobacco Use  . Smoking status: Never Smoker  . Smokeless tobacco: Never Used  Vaping Use  . Vaping Use: Never used  Substance Use Topics  . Alcohol use: No    Alcohol/week: 0.0 standard drinks  . Drug use: No   Family History  Problem Relation Age of Onset  . Kidney cancer Mother   . Colon cancer Neg Hx   . Esophageal cancer Neg Hx   . Rectal cancer Neg Hx   . Stomach cancer Neg Hx    Allergies  Allergen Reactions  . Penicillins Other (See Comments)    REACTION: unknown Childhood   Current Outpatient Medications on File Prior to Visit  Medication Sig Dispense Refill  . albuterol (VENTOLIN HFA) 108 (90 Base) MCG/ACT inhaler Inhale 2 puffs into the lungs every 4 (four) hours.    . Aspirin-Salicylamide-Caffeine (BC HEADACHE POWDER PO) Take 1 application by mouth daily as needed (Headache).     . Cholecalciferol (VITAMIN D-3) 125 MCG (5000 UT) TABS Take 5,000 Units by mouth daily.    Marland Kitchen DHEA 10 MG TABS Take 10 mg by mouth daily.     . Digestive Enzymes (SUPER ENZYMES PO) Take 1 tablet by mouth daily.    . fexofenadine (ALLEGRA) 180 MG tablet Take 180 mg by mouth daily as needed for allergies.     Marland Kitchen ibuprofen (ADVIL,MOTRIN) 200 MG tablet Take 200 mg by mouth every 6 (six) hours as needed for headache or moderate pain.     . meloxicam (MOBIC) 15 MG tablet Take 1 tablet (15 mg total) by mouth daily. 30 tablet 3  . naproxen sodium (ALEVE) 220 MG tablet Take 220 mg by mouth daily as needed (headache).     . Probiotic Product (PROBIOTIC DAILY PO) Take 1 tablet by mouth daily.     . ramipril (ALTACE) 5 MG capsule Take 1 capsule (5 mg total) by mouth daily. 90 capsule 3   No current facility-administered medications on file prior to visit.    Review of Systems  Constitutional: Negative for activity change, appetite change, fatigue, fever and unexpected weight  change.  HENT: Negative for congestion, ear pain, rhinorrhea, sinus pressure and sore throat.   Eyes: Negative for pain, redness and visual disturbance.  Respiratory: Negative for cough, shortness of breath and wheezing.   Cardiovascular: Negative for chest pain and palpitations.  Gastrointestinal: Negative for abdominal pain, blood in stool, constipation and diarrhea.  Endocrine: Negative for polydipsia and polyuria.  Genitourinary: Negative for dysuria, frequency and urgency.  Musculoskeletal: Negative for arthralgias, back pain and myalgias.  Skin: Negative for pallor and rash.  Allergic/Immunologic: Negative for environmental allergies.  Neurological: Positive for headaches. Negative for dizziness and syncope.  Hematological: Negative for adenopathy. Does not bruise/bleed easily.  Psychiatric/Behavioral: Negative for decreased concentration and dysphoric mood. The patient is not nervous/anxious.        Objective:   Physical Exam Constitutional:      General: She is not in acute distress.    Appearance: Normal appearance. She is well-developed. She is obese. She is not ill-appearing or diaphoretic.  HENT:     Head: Normocephalic and atraumatic.     Right Ear: Tympanic membrane, ear canal and external ear normal.     Left Ear: Tympanic membrane, ear canal and external ear normal.     Nose: Nose normal. No congestion.     Mouth/Throat:     Mouth: Mucous membranes are moist.     Pharynx: Oropharynx is clear. No posterior oropharyngeal erythema.  Eyes:     General: No scleral icterus.    Extraocular Movements: Extraocular movements intact.     Conjunctiva/sclera: Conjunctivae normal.     Pupils: Pupils are equal, round, and reactive to light.  Neck:     Thyroid: No thyromegaly.     Vascular: No carotid bruit or JVD.  Cardiovascular:     Rate and Rhythm: Normal rate and regular rhythm.     Pulses: Normal pulses.     Heart sounds: Normal heart sounds. No gallop.   Pulmonary:      Effort: Pulmonary effort is normal. No respiratory distress.     Breath sounds: Normal breath sounds. No wheezing.     Comments: Good air exch Chest:     Chest wall: No tenderness.  Abdominal:     General: Bowel sounds are normal. There is no distension or abdominal bruit.     Palpations: Abdomen is soft. There is no mass.     Tenderness: There is no abdominal tenderness.     Hernia: No hernia is present.  Genitourinary:    Comments: Breast exam: No mass, nodules, thickening, tenderness, bulging, retraction, inflamation, nipple discharge or skin changes noted.  No axillary or clavicular LA.     Musculoskeletal:        General: No tenderness. Normal range of motion.     Cervical back: Normal range of motion and neck supple. No rigidity. No muscular tenderness.     Right lower leg: No edema.     Left lower leg: No edema.     Comments: No kyphosis   Lymphadenopathy:     Cervical: No cervical adenopathy.  Skin:    General: Skin is warm and dry.     Coloration: Skin is not pale.     Findings: No erythema or rash.     Comments: Solar lentigines diffusely   Neurological:     Mental Status: She is alert. Mental status is at baseline.     Cranial Nerves: No cranial nerve deficit.     Motor: No abnormal muscle tone.     Coordination: Coordination normal.     Gait: Gait normal.     Deep Tendon Reflexes: Reflexes are normal and symmetric. Reflexes normal.  Psychiatric:        Mood and Affect: Mood normal.        Cognition and Memory: Cognition and memory normal.  Assessment & Plan:   Problem List Items Addressed This Visit      Cardiovascular and Mediastinum   Migraine headache    imitrex refill done for prn use        Relevant Medications   SUMAtriptan (IMITREX) 50 MG tablet   Essential hypertension, benign    bp in fair control at this time  BP Readings from Last 1 Encounters:  01/19/21 132/80   No changes needed Most recent labs reviewed  Disc lifstyle  change with low sodium diet and exercise  Plan to continue altace 5 mg daily         Other   Routine general medical examination at a health care facility - Primary    Reviewed health habits including diet and exercise and skin cancer prevention Reviewed appropriate screening tests for age  Also reviewed health mt list, fam hx and immunization status , as well as social and family history   See HPI Labs reviewed  Declines covid vaccination  Declines shingrix imm Declines flu shots  Given reminder to schedule mammogram (nl exam) Pap utd Colonoscopy utd  Encouraged exercise to raise HDL cholesterol        Prediabetes    Lab Results  Component Value Date   HGBA1C 6.0 12/07/2020   disc imp of low glycemic diet and wt loss to prevent DM2

## 2021-01-19 NOTE — Assessment & Plan Note (Signed)
Lab Results  Component Value Date   HGBA1C 6.0 12/07/2020   disc imp of low glycemic diet and wt loss to prevent DM2

## 2021-01-19 NOTE — Assessment & Plan Note (Signed)
imitrex refill done for prn use

## 2021-01-19 NOTE — Assessment & Plan Note (Signed)
bp in fair control at this time  BP Readings from Last 1 Encounters:  01/19/21 132/80   No changes needed Most recent labs reviewed  Disc lifstyle change with low sodium diet and exercise  Plan to continue altace 5 mg daily

## 2021-01-19 NOTE — Assessment & Plan Note (Signed)
Reviewed health habits including diet and exercise and skin cancer prevention Reviewed appropriate screening tests for age  Also reviewed health mt list, fam hx and immunization status , as well as social and family history   See HPI Labs reviewed  Declines covid vaccination  Declines shingrix imm Declines flu shots  Given reminder to schedule mammogram (nl exam) Pap utd Colonoscopy utd  Encouraged exercise to raise HDL cholesterol

## 2021-02-15 ENCOUNTER — Encounter: Payer: Self-pay | Admitting: Family Medicine

## 2021-03-02 ENCOUNTER — Encounter: Payer: Self-pay | Admitting: Family Medicine

## 2021-03-17 ENCOUNTER — Encounter: Payer: Self-pay | Admitting: Family Medicine

## 2021-05-11 ENCOUNTER — Other Ambulatory Visit: Payer: Self-pay | Admitting: Family Medicine

## 2021-05-11 DIAGNOSIS — I10 Essential (primary) hypertension: Secondary | ICD-10-CM

## 2021-12-20 ENCOUNTER — Encounter: Payer: Self-pay | Admitting: Family Medicine

## 2021-12-20 ENCOUNTER — Other Ambulatory Visit: Payer: Self-pay

## 2021-12-20 ENCOUNTER — Ambulatory Visit: Payer: BC Managed Care – PPO | Admitting: Family Medicine

## 2021-12-20 ENCOUNTER — Ambulatory Visit (INDEPENDENT_AMBULATORY_CARE_PROVIDER_SITE_OTHER)
Admission: RE | Admit: 2021-12-20 | Discharge: 2021-12-20 | Disposition: A | Discharge status: Home / Self Care | Payer: BC Managed Care – PPO | Source: Ambulatory Visit | Attending: Family Medicine | Admitting: Family Medicine

## 2021-12-20 DIAGNOSIS — G8929 Other chronic pain: Secondary | ICD-10-CM | POA: Diagnosis not present

## 2021-12-20 DIAGNOSIS — M545 Low back pain, unspecified: Secondary | ICD-10-CM | POA: Insufficient documentation

## 2021-12-20 DIAGNOSIS — M5441 Lumbago with sciatica, right side: Secondary | ICD-10-CM | POA: Diagnosis not present

## 2021-12-20 NOTE — Progress Notes (Signed)
? ?Subjective:  ? ? Patient ID: Amber Baker, female    DOB: 06/22/1966, 56 y.o.   MRN: [id number] ? ?This visit occurred during the SARS-CoV-2 public health emergency.  Safety protocols were in place, including screening questions prior to the visit, additional usage of staff PPE, and extensive cleaning of exam room while observing appropriate contact time as indicated for disinfecting solutions.  ? ?HPI ?Pt presents for R leg pain  ? ?Wt Readings from Last 3 Encounters:  ?12/20/21 191 lb (86.6 kg)  ?01/19/21 185 lb 2 oz (84 kg)  ?12/07/20 185 lb 3 oz (84 kg)  ? ?33.30 kg/m? ? ? ? ?2 weeks ?In hip area going to the leg  (by hip she means buttock) ?No trauma  ?A little better today  ?Has remote h/o deg disk  ? ?Worse to cross leg  ?Lying down at night sharp shooting pain goes down leg (esp knee and lat shin)  ? ?? When last xray was (chiropractor years ago)  ? ? ?Taking aleve  ?Heating pad  ?Has used topical cbd  ? ?Patient Active Problem List  ? Diagnosis Date Noted  ? Low back pain 12/20/2021  ? Prediabetes 07/12/2020  ? History of COVID-19 07/12/2020  ? Fatigue 08/29/2016  ? Fecal incontinence 07/25/2016  ? Chronic paronychia of finger 07/25/2016  ? Encounter for routine gynecological examination 05/23/2016  ? Routine general medical examination at a health care facility 02/14/2013  ? Allergic rhinitis 02/14/2013  ? Migraine headache 11/27/2007  ? Essential hypertension, benign 08/10/2007  ? ?Past Medical History:  ?Diagnosis Date  ? Abdominal pain, generalized   ? Abnormal weight gain   ? Acute upper respiratory infections of unspecified site   ? Allergy   ? Arthritis   ? Feet - diagnosed by Podiatrist  ? Essential hypertension, benign   ? Gastroenteritis   ? Migraine, unspecified, without mention of intractable migraine without mention of status migrainosus   ? Routine general medical examination at a health care facility   ? ?Past Surgical History:  ?Procedure Laterality Date  ? CHOLECYSTECTOMY    ? OVARY  SURGERY Left   ? ?Social History  ? ?Tobacco Use  ? Smoking status: Never  ? Smokeless tobacco: Never  ?Vaping Use  ? Vaping Use: Never used  ?Substance Use Topics  ? Alcohol use: No  ?  Alcohol/week: 0.0 standard drinks  ? Drug use: No  ? ?Family History  ?Problem Relation Age of Onset  ? Kidney cancer Mother   ? Colon cancer Neg Hx   ? Esophageal cancer Neg Hx   ? Rectal cancer Neg Hx   ? Stomach cancer Neg Hx   ? ?Allergies  ?Allergen Reactions  ? Penicillins Other (See Comments)  ?  REACTION: unknown ?Childhood  ? ?Current Outpatient Medications on File Prior to Visit  ?Medication Sig Dispense Refill  ? Aspirin-Salicylamide-Caffeine (BC HEADACHE POWDER PO) Take 1 application by mouth daily as needed (Headache).     ? Cholecalciferol (VITAMIN D-3) 125 MCG (5000 UT) TABS Take 5,000 Units by mouth daily.    ? DHEA 10 MG TABS Take 10 mg by mouth daily.     ? Digestive Enzymes (SUPER ENZYMES PO) Take 1 tablet by mouth daily.    ? fexofenadine (ALLEGRA) 180 MG tablet Take 180 mg by mouth daily as needed for allergies.     ? ibuprofen (ADVIL,MOTRIN) 200 MG tablet Take 200 mg by mouth every 6 (six) hours as needed for headache or  moderate pain.     ? naproxen sodium (ALEVE) 220 MG tablet Take 220 mg by mouth daily as needed (headache).     ? Probiotic Product (PROBIOTIC DAILY PO) Take 1 tablet by mouth daily.     ? ramipril (ALTACE) 5 MG capsule TAKE 1 CAPSULE BY MOUTH ONCE DAILY 90 capsule 2  ? SUMAtriptan (IMITREX) 50 MG tablet May repeat in 2 hours if headache persists or recurs. 9 tablet 11  ? albuterol (VENTOLIN HFA) 108 (90 Base) MCG/ACT inhaler Inhale 2 puffs into the lungs every 4 (four) hours. (Patient not taking: Reported on 12/20/2021)    ? meloxicam (MOBIC) 15 MG tablet Take 1 tablet (15 mg total) by mouth daily. (Patient not taking: Reported on 12/20/2021) 30 tablet 3  ? ?No current facility-administered medications on file prior to visit.  ?  ?Review of Systems  ?Constitutional:  Negative for activity  change, appetite change, fatigue, fever and unexpected weight change.  ?HENT:  Negative for congestion, ear pain, rhinorrhea, sinus pressure and sore throat.   ?Eyes:  Negative for pain, redness and visual disturbance.  ?Respiratory:  Negative for cough, shortness of breath and wheezing.   ?Cardiovascular:  Negative for chest pain and palpitations.  ?Gastrointestinal:  Negative for abdominal pain, blood in stool, constipation and diarrhea.  ?Endocrine: Negative for polydipsia and polyuria.  ?Genitourinary:  Negative for dysuria, frequency and urgency.  ?Musculoskeletal:  Positive for back pain. Negative for arthralgias, gait problem and myalgias.  ?Skin:  Negative for pallor and rash.  ?Allergic/Immunologic: Negative for environmental allergies.  ?Neurological:  Negative for dizziness, syncope and headaches.  ?Hematological:  Negative for adenopathy. Does not bruise/bleed easily.  ?Psychiatric/Behavioral:  Negative for decreased concentration and dysphoric mood. The patient is not nervous/anxious.   ? ?   ?Objective:  ? Physical Exam ?Constitutional:   ?   General: She is not in acute distress. ?   Appearance: Normal appearance. She is well-developed. She is obese. She is not ill-appearing or diaphoretic.  ?HENT:  ?   Head: Normocephalic and atraumatic.  ?Eyes:  ?   General: No scleral icterus. ?   Conjunctiva/sclera: Conjunctivae normal.  ?   Pupils: Pupils are equal, round, and reactive to light.  ?Cardiovascular:  ?   Rate and Rhythm: Normal rate and regular rhythm.  ?Pulmonary:  ?   Effort: Pulmonary effort is normal.  ?   Breath sounds: Normal breath sounds. No wheezing or rales.  ?Abdominal:  ?   General: Bowel sounds are normal. There is no distension.  ?   Palpations: Abdomen is soft.  ?   Tenderness: There is no abdominal tenderness.  ?Musculoskeletal:     ?   General: Tenderness present.  ?   Cervical back: Normal range of motion and neck supple.  ?   Lumbar back: Spasms and tenderness present. No edema or  bony tenderness. Decreased range of motion.  ?   Comments: Tender in R piriformis area  ?No spinal tenderness ?No obv scoliosis or kyphosis  ? ?Nl rom of hips  ?Some pain with ext rot and flex of R hip (reviewed stretch) ?Nl gait ?No neuro changes  ?SLR neg bilat  ?Lymphadenopathy:  ?   Cervical: No cervical adenopathy.  ?Skin: ?   General: Skin is warm and dry.  ?   Coloration: Skin is not pale.  ?   Findings: No erythema or rash.  ?Neurological:  ?   Mental Status: She is alert.  ?   Cranial  Nerves: No cranial nerve deficit.  ?   Sensory: No sensory deficit.  ?   Motor: No weakness, atrophy or abnormal muscle tone.  ?   Coordination: Coordination normal.  ?   Deep Tendon Reflexes: Reflexes are normal and symmetric. Reflexes normal.  ?   Comments: Negative SLR  ?Psychiatric:     ?   Mood and Affect: Mood normal.  ? ? ? ? ? ?   ?Assessment & Plan:  ? ?Problem List Items Addressed This Visit   ? ?  ? Other  ? Low back pain  ?  Acute on chronic and some imp with otc naproxen  ?R piriformis area  ?Rev stretches  ?Xray ordered in light of length of problem  ?Handout incl rehab  ?Ice/heat/cbd topical if helpful  ?May consider chiropractor  ?Pending xray rev may consider PT  ?Enc to keep walking ?  ?  ? Relevant Orders  ? DG Lumbar Spine Complete  ? ? ?

## 2021-12-20 NOTE — Assessment & Plan Note (Signed)
Acute on chronic and some imp with otc naproxen  ?R piriformis area  ?Rev stretches  ?Xray ordered in light of length of problem  ?Handout incl rehab  ?Ice/heat/cbd topical if helpful  ?May consider chiropractor  ?Pending xray rev may consider PT  ?Enc to keep walking ?

## 2021-12-20 NOTE — Patient Instructions (Addendum)
Take a look at the handouts ?Try the stretches/ exercises  ? ? ?Use heat on the affected area for 10 minutes  ?Cbd topical is fine  ?Aleve is ok if taken with food  ? ?Xray today  ? ? ? ?

## 2021-12-21 ENCOUNTER — Telehealth: Payer: Self-pay

## 2021-12-21 NOTE — Telephone Encounter (Signed)
-----   Message from Judy Pimple, MD sent at 12/20/2021  4:26 PM EDT ----- ?Xray is reassuring  ?There is some mild arthropathy (arthritis) at L5-S1 and also some disc space narrowing  ?No fractures or dislocation  ? ?I think some physical therapy would help -would you be open to that?  ?If so what area/location? ?

## 2021-12-21 NOTE — Telephone Encounter (Signed)
Called and lvm for pt call us back regarding x-ray results. ?

## 2021-12-21 NOTE — Telephone Encounter (Signed)
Returned pt 's called regarding x-ray results. ?

## 2021-12-21 NOTE — Telephone Encounter (Signed)
Pt returning call [phone] ? ?

## 2022-01-17 ENCOUNTER — Ambulatory Visit: Payer: BC Managed Care – PPO | Attending: Family Medicine | Admitting: Physical Therapy

## 2022-01-17 DIAGNOSIS — G8929 Other chronic pain: Secondary | ICD-10-CM | POA: Insufficient documentation

## 2022-01-17 DIAGNOSIS — M5441 Lumbago with sciatica, right side: Secondary | ICD-10-CM | POA: Diagnosis present

## 2022-01-17 NOTE — Therapy (Signed)
?OUTPATIENT PHYSICAL THERAPY THORACOLUMBAR EVALUATION ? ? ?Patient Name: Amber Baker ?MRN: [id number] ?DOB:05/31/1966, 56 y.o., female ?Today's Date: 01/18/2022 ? ? PT End of Session - 01/18/22 0917   ? ? Visit Number 1   ? Number of Visits 8   ? Date for PT Re-Evaluation 02/22/22   ? Authorization Type BCBS [id number]   ? PT Start Time 1100   ? PT Stop Time 1145   ? PT Time Calculation (min) 45 min   ? Activity Tolerance Patient tolerated treatment well   ? [facility] tasks assessed/performed   ? ?  ?  ? ?  ? ? ?Past Medical History:  ?Diagnosis Date  ? Abdominal pain, generalized   ? Abnormal weight gain   ? Acute upper respiratory infections of unspecified site   ? Allergy   ? Arthritis   ? Feet - diagnosed by Podiatrist  ? Essential hypertension, benign   ? Gastroenteritis   ? Migraine, unspecified, without mention of intractable migraine without mention of status migrainosus   ? Routine general medical examination at a health care facility   ? ?Past Surgical History:  ?Procedure Laterality Date  ? CHOLECYSTECTOMY    ? OVARY SURGERY Left   ? ?Patient Active Problem List  ? Diagnosis Date Noted  ? Low back pain 12/20/2021  ? Prediabetes 07/12/2020  ? History of COVID-19 07/12/2020  ? Fatigue 08/29/2016  ? Fecal incontinence 07/25/2016  ? Chronic paronychia of finger 07/25/2016  ? Encounter for routine gynecological examination 05/23/2016  ? Routine general medical examination at a health care facility 02/14/2013  ? Allergic rhinitis 02/14/2013  ? Migraine headache 11/27/2007  ? Essential hypertension, benign 08/10/2007  ? ? ?PCP: Judy Pimpleower, Marne A, MD ? ?REFERRING PROVIDER: Judy Pimpleower, Marne A, MD ? ?REFERRING DIAG: Right sided low back pain  ? ?THERAPY DIAG:  ?No diagnosis found. ? ?ONSET DATE: 12/17/21 ? ?SUBJECTIVE:                                                                                                                                                                                           ? ?SUBJECTIVE STATEMENT: ?Pt reports initially going to PCP because of increase right hip pain especially when going upstairs or cross her right leg over her knee.  ? ? ?PERTINENT HISTORY:  ?Dr. Roxy MannsMarne Tower 12/21/21 ? ? ?CLINICAL DATA:  Right hip pain radiating into right anterior lower ?leg. ?  ?EXAM: ?LUMBAR SPINE - COMPLETE 4+ VIEW ?  ?COMPARISON:  None. ?  ?FINDINGS: ?Five non-rib-bearing lumbar vertebral bodies. ?  ?\Mild facet arthropathy at the L5-S1 level. There is no  evidence ?of lumbar spine fracture. Alignment is normal. Mild intervertebral ?disc space narrowing at the L5-S1 level. ?  ?Right upper quadrant surgical clips. ?  ?IMPRESSION: ?No acute displaced fracture or traumatic listhesis of the lumbar ?spine. ? ?PAIN:  ?Are you having pain? Yes: NPRS scale: 3/10 ?Pain location: Right iliac crest to right lateral condyle of tibia  ?Pain description: Shooting and stabbing pain especially when laying down at night  ?Aggravating factors: Laying down on her right side ?Relieving factors: Alieve and TENS unit  and CBD stick  ? ? ?PRECAUTIONS: None ? ?WEIGHT BEARING RESTRICTIONS No ? ?FALLS:  ?Has patient fallen in last 6 months? No ? ?LIVING ENVIRONMENT: ?Lives with: lives with their family and lives with their spouse ?Lives in: House/apartment ?Stairs: Yes: Internal: 3-4 steps; can reach both ?Has following equipment at home: None ? ?OCCUPATION: Hair Stylist  ? ?PLOF: Independent ? ?PATIENT GOALS Decrease pain when climbing stairs and laying down on her side  ? ? ?OBJECTIVE:  ? ?Red Flags  ? ?- Cauda Equina Syndrome: Negative to all symptoms  ?Bladder/bowel dysfunction ?Saddle anesthesia  ?Sexual dysfunction ?Possible neurological deficits in the lower limb (motor or sensory loss, reflex change) ? ? ? ?            VITALS: 160/100 HR 86 SpO2 100 ?               ?DIAGNOSTIC FINDINGS:  ?Dr. Roxy Manns 12/21/21 ? ? ?CLINICAL DATA:  Right hip pain radiating into right anterior lower ?leg. ?  ?EXAM: ?LUMBAR SPINE -  COMPLETE 4+ VIEW ?  ?COMPARISON:  None. ?  ?FINDINGS: ?Five non-rib-bearing lumbar vertebral bodies. ?  ?\Mild facet arthropathy at the L5-S1 level. There is no evidence ?of lumbar spine fracture. Alignment is normal. Mild intervertebral ?disc space narrowing at the L5-S1 level. ?  ?Right upper quadrant surgical clips. ?  ?IMPRESSION: ?No acute displaced fracture or traumatic listhesis of the lumbar ?spine. ? ?PATIENT SURVEYS:  ?FOTO 62/72 ? ?SCREENING FOR RED FLAGS: ?Bowel or bladder incontinence: No ?Spinal tumors: No ?Cauda equina syndrome: No ?Compression fracture: No ?Abdominal aneurysm: No ? ?COGNITION: ? Overall cognitive status: Within functional limits for tasks assessed   ?  ?SENSATION: ?WFL ? ?MUSCLE LENGTH: ?Hamstrings: Right 70 deg; Left 70 deg ?Thomas test:  ? ?POSTURE:  ?Normal posture  ? ?PALPATION: ?Lateral condyle of right knee and  ? ?LUMBAR ROM:  ? ?Active  A/PROM  ?01/18/2022  ?Flexion 100%  ?Extension 100%  ?Right lateral flexion 100%*  ?Left lateral flexion 100%  ?Right rotation 100%  ?Left rotation 100%  ? (Blank rows = not tested) ? ?LE ROM: ? ?Active  Right ?01/18/2022 Left ?01/18/2022  ?Hip flexion 120 120  ?Hip extension 30 30  ?Hip abduction 45 45  ?Hip adduction 30 30  ?Hip internal rotation    ?Hip external rotation    ?Knee flexion 135 135  ?Knee extension 0 0  ?Ankle dorsiflexion 20 20  ?Ankle plantarflexion    ?Ankle inversion    ?Ankle eversion    ? (Blank rows = not tested) ? ? ? ? ?LE MMT: ? ?MMT Right ?01/18/2022 Left ?01/18/2022  ?Hip flexion 5/5 5/5  ?Hip extension 5/5 5/5  ?Hip abduction 5/5 5/5  ?Hip adduction 4/5 4/5  ?Hip internal rotation    ?Hip external rotation    ?Knee flexion 5/5 5/5  ?Knee extension 5/5 5/5  ?Ankle dorsiflexion 5/5 5/5  ?Ankle plantarflexion    ?Ankle inversion    ?  Ankle eversion    ? (Blank rows = not tested) ? ? ? ?LUMBAR SPECIAL TESTS:  ?FABER test: Negative and Thomas test: Positive ? ? ?TODAY'S TREATMENT  ?Sidelying IT Band Stretch 2 x 60 sec   ? ? ? ?PATIENT EDUCATION:  ?Education details: Form and technique for appropriate exercise and explanation of plan of care  ?Person educated: Patient ?Education method: Explanation, Demonstration, Verbal cues, and Handouts ?Education comprehension: verbalized understanding, returned demonstration, and verbal cues required ? ? ?HOME EXERCISE PROGRAM: ?Access Code: [email or web address] ?URL: [email or web address] ?Date: 01/17/2022 ?Prepared by: Ellin Goodie ? ?Exercises ?- Sidelying ITB Stretch off Table  - 1 x daily - 7 x weekly - 1 sets - 3 reps - 60 hold ?- Supine Figure 4 Piriformis Stretch  - 1 x daily - 7 x weekly - 1 sets - 3 reps - 60 hold ? ?ASSESSMENT: ? ?CLINICAL IMPRESSION: ?Patient is a 56 y.o. female who was seen today for physical therapy evaluation and treatment for right sided low back and hip pain. She demonstrates signs and symptoms of piriformis and/or tensor fascia latae pain with focal pain on right hip and on right lateral condyle and relief when performing these stretches. She also exhibits decreased hip flexibility and hip strength. She will benefit from skilled PT to decrease right hip and low back pain to return to standing for prolonged periods for her job as a hair stylist without pain or discomfort.   ? ? ?OBJECTIVE IMPAIRMENTS impaired flexibility and pain.  ? ?ACTIVITY LIMITATIONS occupation.  ? ?PERSONAL FACTORS 1 comorbidity: HTN  are also affecting patient's functional outcome.  ? ? ?REHAB POTENTIAL: Good ? ?CLINICAL DECISION MAKING: Stable/uncomplicated ? ?EVALUATION COMPLEXITY: Low ? ? ?GOALS: ?Goals reviewed with patient? No ? ?SHORT TERM GOALS: Target date: 02/01/2022 ? ?Pt will be independent with HEP in order to improve strength and balance in order to decrease fall risk and improve function at home and work. ?Baseline: NT  ?Goal status: INITIAL ? ?2.  Pt will improve hip adduction strength to 5/5 to improve overall hip strength to decrease load on hip abductors to resolve  lateral right hip pain.  ?Baseline: Hip Adduction R/L 4/4 ?Goal status: INITIAL ? ? ? ?LONG TERM GOALS: Target date: 02/22/2022 ? ?Patient will have improved function and activity level as evidenced by an increase

## 2022-01-18 ENCOUNTER — Encounter: Payer: Self-pay | Admitting: Physical Therapy

## 2022-01-18 NOTE — Addendum Note (Signed)
Addended by: Daneil Dan on: 01/18/2022 01:25 PM ? ? Modules accepted: Orders ? ?

## 2022-01-19 ENCOUNTER — Encounter: Payer: Self-pay | Admitting: Physical Therapy

## 2022-01-19 ENCOUNTER — Ambulatory Visit: Payer: BC Managed Care – PPO | Admitting: Physical Therapy

## 2022-01-19 DIAGNOSIS — G8929 Other chronic pain: Secondary | ICD-10-CM

## 2022-01-19 DIAGNOSIS — M5441 Lumbago with sciatica, right side: Secondary | ICD-10-CM | POA: Diagnosis not present

## 2022-01-19 NOTE — Therapy (Signed)
?OUTPATIENT PHYSICAL THERAPY TREATMENT NOTE ? ? ?Patient Name: Amber Baker ?MRN: [id number] ?DOB:10/24/1965, 56 y.o., female ?Today's Date: 01/19/2022 ? ?PCP: Judy Pimple, MD ?REFERRING PROVIDER: Judy Pimple, MD ? ?END OF SESSION:  ? PT End of Session - 01/19/22 0856   ? ? Visit Number 2   ? Number of Visits 8   ? Date for PT Re-Evaluation 02/22/22   ? Authorization Type BCBS 2023   ? PT Start Time 0805   ? PT Stop Time 0845   ? PT Time Calculation (min) 40 min   ? Activity Tolerance Patient tolerated treatment well   ? Behavior During Therapy [facility] for tasks assessed/performed   ? ?  ?  ? ?  ? ? ?Past Medical History:  ?Diagnosis Date  ? Abdominal pain, generalized   ? Abnormal weight gain   ? Acute upper respiratory infections of unspecified site   ? Allergy   ? Arthritis   ? Feet - diagnosed by Podiatrist  ? Essential hypertension, benign   ? Gastroenteritis   ? Migraine, unspecified, without mention of intractable migraine without mention of status migrainosus   ? Routine general medical examination at a health care facility   ? ?Past Surgical History:  ?Procedure Laterality Date  ? CHOLECYSTECTOMY    ? OVARY SURGERY Left   ? ?Patient Active Problem List  ? Diagnosis Date Noted  ? Low back pain 12/20/2021  ? Prediabetes 07/12/2020  ? History of COVID-19 07/12/2020  ? Fatigue 08/29/2016  ? Fecal incontinence 07/25/2016  ? Chronic paronychia of finger 07/25/2016  ? Encounter for routine gynecological examination 05/23/2016  ? Routine general medical examination at a health care facility 02/14/2013  ? Allergic rhinitis 02/14/2013  ? Migraine headache 11/27/2007  ? Essential hypertension, benign 08/10/2007  ? ? ?REFERRING DIAG: Right sided low back, hip, and knee pain  ? ?THERAPY DIAG:  ?Chronic right-sided low back pain with right-sided sciatica ? ?PERTINENT HISTORY: Dr. Roxy Manns 12/21/21 ?  ?  ?CLINICAL DATA:  Right hip pain radiating into right anterior lower ?leg. ?  ?EXAM: ?LUMBAR SPINE - COMPLETE 4+  VIEW ?  ?COMPARISON:  None. ?  ?FINDINGS: ?Five non-rib-bearing lumbar vertebral bodies. ?  ?\Mild facet arthropathy at the L5-S1 level. There is no evidence ?of lumbar spine fracture. Alignment is normal. Mild intervertebral ?disc space narrowing at the L5-S1 level. ?  ?Right upper quadrant surgical clips. ?  ?IMPRESSION: ?No acute displaced fracture or traumatic listhesis of the lumbar ?spine. ? ?PRECAUTIONS: None  ? ?SUBJECTIVE: Reports increased pain in right tib anterior and lateral side of right knee after performing exercises close to bed time. Pain kept her up and only alieve brought her relief  ? ?PAIN:  ?Are you having pain? Yes: NPRS scale: 5/10 ?Pain location: Right tib anterior  ?Pain description: Lambert Mody ?Aggravating factors: Transferring from sit to stand  ?Relieving factors: Alieve  ? ? ?OBJECTIVE:  ?  ?Red Flags  ?  ?- Cauda Equina Syndrome: Negative to all symptoms  ?Bladder/bowel dysfunction ?Saddle anesthesia  ?Sexual dysfunction ?Possible neurological deficits in the lower limb (motor or sensory loss, reflex change) ?  ?  ?  ?            VITALS: 160/100 HR 86 SpO2 100 ?               ?DIAGNOSTIC FINDINGS:  ?Dr. Roxy Manns 12/21/21 ?  ?  ?CLINICAL DATA:  Right hip pain radiating into right anterior lower ?  leg. ?  ?EXAM: ?LUMBAR SPINE - COMPLETE 4+ VIEW ?  ?COMPARISON:  None. ?  ?FINDINGS: ?Five non-rib-bearing lumbar vertebral bodies. ?  ?\Mild facet arthropathy at the L5-S1 level. There is no evidence ?of lumbar spine fracture. Alignment is normal. Mild intervertebral ?disc space narrowing at the L5-S1 level. ?  ?Right upper quadrant surgical clips. ?  ?IMPRESSION: ?No acute displaced fracture or traumatic listhesis of the lumbar ?spine. ?  ?PATIENT SURVEYS:  ?FOTO 62/72 ?  ?SCREENING FOR RED FLAGS: ?Bowel or bladder incontinence: No ?Spinal tumors: No ?Cauda equina syndrome: No ?Compression fracture: No ?Abdominal aneurysm: No ?  ?COGNITION: ?          Overall cognitive status: Within functional  limits for tasks assessed               ?           ?SENSATION: ?WFL ?  ?MUSCLE LENGTH: ?Hamstrings: Right 70 deg; Left 70 deg ?Thomas test:  ?  ?POSTURE:  ?Normal posture  ?  ?PALPATION: ?Lateral condyle of right knee and  ?  ?LUMBAR ROM:  ?  ?Active  A/PROM  ?01/18/2022  ?Flexion 100%  ?Extension 100%  ?Right lateral flexion 100%*  ?Left lateral flexion 100%  ?Right rotation 100%  ?Left rotation 100%  ? (Blank rows = not tested) ?  ?LE ROM: ?  ?Active  Right ?01/18/2022 Left ?01/18/2022  ?Hip flexion 120 120  ?Hip extension 30 30  ?Hip abduction 45 45  ?Hip adduction 30 30  ?Hip internal rotation      ?Hip external rotation      ?Knee flexion 135 135  ?Knee extension 0 0  ?Ankle dorsiflexion 20 20  ?Ankle plantarflexion      ?Ankle inversion      ?Ankle eversion      ? (Blank rows = not tested) ?  ?  ? ?  ?LE MMT: ?  ?MMT Right ?01/18/2022 Left ?01/18/2022  ?Hip flexion 5/5 5/5  ?Hip extension 5/5 5/5  ?Hip abduction 5/5 5/5  ?Hip adduction 4/5 4/5  ?Hip internal rotation      ?Hip external rotation      ?Knee flexion 5/5 5/5  ?Knee extension 5/5 5/5  ?Ankle dorsiflexion 5/5 5/5  ?Ankle plantarflexion      ?Ankle inversion      ?Ankle eversion      ? (Blank rows = not tested) ?  ?  ?  ?LUMBAR SPECIAL TESTS:  ?FABER test: Negative and Thomas test: Positive ?  ?  ?TODAY'S TREATMENT  ? ?01/19/22: ? ?MANUAL THERAPY: ? ?Soft tissue massage of tib anterior ?Palpation of lateral to right knee quad tendon.   ? ?THEREX: ?Standing Dorsiflexion Stretch 3 x 60 sec  ?Forward Bend Stretch 3 x 30 sec  ?           Mini-Squat to 90/90 3 x 10  ?           Mini-Squat to 90/90 with #10 DB hold 1 x 10  ?           Hip Adduction 3 x 10  ? ?01/17/22 ? ?THEREX  ? ?Sidelying IT Band Stretch 2 x 60 sec  ?  ?  ?  ?PATIENT EDUCATION:  ?Education details: Form and technique for appropriate exercise and explanation of plan of care  ?Person educated: Patient ?Education method: Explanation, Demonstration, Verbal cues, and Handouts ?Education  comprehension: verbalized understanding, returned demonstration, and verbal cues required ?  ?  ?  HOME EXERCISE PROGRAM: ?Access Code: [email or web address] ?[email or web address]: [email or web address] ?Date: 01/19/2022 ?Prepared by: Ellin Goodieaniel Upton Russey ? ?Exercises ?- Sidelying ITB Stretch off Table  - 1 x daily - 7 x weekly - 1 sets - 3 reps - 60 hold ?- Supine Figure 4 Piriformis Stretch  - 1 x daily - 7 x weekly - 1 sets - 3 reps - 60 hold ?- Sidelying Hip Adduction  - 1 x daily - 3 x weekly - 3 sets - 10 reps ?- Squat with Chair Touch  - 1 x daily - 3 x weekly - 3 sets - 10 reps ?  ?ASSESSMENT: ?  ?CLINICAL IMPRESSION: ?Pt presents for follow-up for right sided low back and hip pain. She initially presents to session with increased right anterior tibialis pain and what appears to be right quad pain. RLE knee pain was initially diagnosed as deficit with IT band, but given pain localized to quad tendon suggests that it could potentially be quad tendinopathy especially because pain occurs during knee extension. She was able to complete all exercises without an increase in her symptoms even when activating quads during squat. PT recommends pt break up exercises to avoid over exertion before bed time.   ?  ?  ?OBJECTIVE IMPAIRMENTS impaired flexibility and pain.  ?  ?ACTIVITY LIMITATIONS occupation.  ?  ?PERSONAL FACTORS 1 comorbidity: HTN  are also affecting patient's functional outcome.  ?  ?  ?REHAB POTENTIAL: Good ?  ?CLINICAL DECISION MAKING: Stable/uncomplicated ?  ?EVALUATION COMPLEXITY: Low ?  ?  ?GOALS: ?Goals reviewed with patient? No ?  ?SHORT TERM GOALS: Target date: 02/01/2022 ?  ?Pt will be independent with HEP in order to improve strength and balance in order to decrease fall risk and improve function at home and work. ?Baseline: NT  ?Goal status: INITIAL ?  ?2.  Pt will improve hip adduction strength to 5/5 to improve overall hip strength to decrease load on hip abductors to resolve lateral right hip pain.  ?Baseline: Hip  Adduction R/L 4/4 ?Goal status: INITIAL ?  ?  ?  ?LONG TERM GOALS: Target date: 02/22/2022 ?  ?Patient will have improved function and activity level as evidenced by an increase in FOTO score by 10 points or more.  ?

## 2022-01-24 ENCOUNTER — Ambulatory Visit: Payer: BC Managed Care – PPO | Admitting: Physical Therapy

## 2022-01-24 ENCOUNTER — Encounter: Payer: Self-pay | Admitting: Physical Therapy

## 2022-01-24 DIAGNOSIS — M5441 Lumbago with sciatica, right side: Secondary | ICD-10-CM | POA: Diagnosis not present

## 2022-01-24 DIAGNOSIS — G8929 Other chronic pain: Secondary | ICD-10-CM

## 2022-01-24 NOTE — Therapy (Signed)
?OUTPATIENT PHYSICAL THERAPY TREATMENT NOTE ? ? ?Patient Name: Amber Baker ?MRN: [id number] ?DOB:25-Feb-1966, 56 y.o., female ?Today's Date: 01/24/2022 ? ?PCP: Judy Pimple, MD ?REFERRING PROVIDER: Judy Pimple, MD ? ?END OF SESSION:  ? PT End of Session - 01/24/22 0854   ? ? Visit Number 3   ? Number of Visits 8   ? Date for PT Re-Evaluation 02/22/22   ? Authorization Type BCBS [id number]   ? PT Start Time 0845   ? PT Stop Time 0930   ? PT Time Calculation (min) 45 min   ? Activity Tolerance Patient tolerated treatment well   ? Behavior During Therapy [facility] for tasks assessed/performed   ? ?  ?  ? ?  ? ? ? ?Past Medical History:  ?Diagnosis Date  ? Abdominal pain, generalized   ? Abnormal weight gain   ? Acute upper respiratory infections of unspecified site   ? Allergy   ? Arthritis   ? Feet - diagnosed by Podiatrist  ? Essential hypertension, benign   ? Gastroenteritis   ? Migraine, unspecified, without mention of intractable migraine without mention of status migrainosus   ? Routine general medical examination at a health care facility   ? ?Past Surgical History:  ?Procedure Laterality Date  ? CHOLECYSTECTOMY    ? OVARY SURGERY Left   ? ?Patient Active Problem List  ? Diagnosis Date Noted  ? Low back pain 12/20/2021  ? Prediabetes 07/12/2020  ? History of COVID-19 07/12/2020  ? Fatigue 08/29/2016  ? Fecal incontinence 07/25/2016  ? Chronic paronychia of finger 07/25/2016  ? Encounter for routine gynecological examination 05/23/2016  ? Routine general medical examination at a health care facility 02/14/2013  ? Allergic rhinitis 02/14/2013  ? Migraine headache 11/27/2007  ? Essential hypertension, benign 08/10/2007  ? ? ?REFERRING DIAG: Right sided low back, hip, and knee pain  ? ?THERAPY DIAG:  ?Chronic right-sided low back pain with right-sided sciatica ? ?PERTINENT HISTORY: Dr. Roxy Manns 12/21/21 ?  ?  ?CLINICAL DATA:  Right hip pain radiating into right anterior lower ?leg. ?  ?EXAM: ?LUMBAR SPINE - COMPLETE 4+  VIEW ?  ?COMPARISON:  None. ?  ?FINDINGS: ?Five non-rib-bearing lumbar vertebral bodies. ?  ?\Mild facet arthropathy at the L5-S1 level. There is no evidence ?of lumbar spine fracture. Alignment is normal. Mild intervertebral ?disc space narrowing at the L5-S1 level. ?  ?Right upper quadrant surgical clips. ?  ?IMPRESSION: ?No acute displaced fracture or traumatic listhesis of the lumbar ?spine. ? ?PRECAUTIONS: None  ? ?SUBJECTIVE: Pt reports an improvement in her pain. She thinks that it might have been due to increased pain in  ? ?PAIN:  ?Are you having pain? Yes: NPRS scale: 2/10 ?Pain location: Right tib anterior  ?Pain description: Lambert Mody ?Aggravating factors: Transferring from sit to stand  ?Relieving factors: Alieve  ? ? ?OBJECTIVE:  ?  ?Red Flags  ?  ?- Cauda Equina Syndrome: Negative to all symptoms  ?Bladder/bowel dysfunction ?Saddle anesthesia  ?Sexual dysfunction ?Possible neurological deficits in the lower limb (motor or sensory loss, reflex change) ?  ?  ?  ?            VITALS: 160/100 HR 86 SpO2 100 ?               ?DIAGNOSTIC FINDINGS:  ?Dr. Roxy Manns 12/21/21 ?  ?  ?CLINICAL DATA:  Right hip pain radiating into right anterior lower ?leg. ?  ?EXAM: ?LUMBAR SPINE - COMPLETE 4+ VIEW ?  ?  COMPARISON:  None. ?  ?FINDINGS: ?Five non-rib-bearing lumbar vertebral bodies. ?  ?\Mild facet arthropathy at the L5-S1 level. There is no evidence ?of lumbar spine fracture. Alignment is normal. Mild intervertebral ?disc space narrowing at the L5-S1 level. ?  ?Right upper quadrant surgical clips. ?  ?IMPRESSION: ?No acute displaced fracture or traumatic listhesis of the lumbar ?spine. ?  ?PATIENT SURVEYS:  ?FOTO 62/72 ?  ?SCREENING FOR RED FLAGS: ?Bowel or bladder incontinence: No ?Spinal tumors: No ?Cauda equina syndrome: No ?Compression fracture: No ?Abdominal aneurysm: No ?  ?COGNITION: ?          Overall cognitive status: Within functional limits for tasks assessed               ?           ?SENSATION: ?WFL ?   ?MUSCLE LENGTH: ?Hamstrings: Right 70 deg; Left 70 deg ?Thomas test:  ?  ?POSTURE:  ?Normal posture  ?  ?PALPATION: ?Lateral condyle of right knee and  ?  ?LUMBAR ROM:  ?  ?Active  A/PROM  ?01/18/2022  ?Flexion 100%  ?Extension 100%  ?Right lateral flexion 100%*  ?Left lateral flexion 100%  ?Right rotation 100%  ?Left rotation 100%  ? (Blank rows = not tested) ?  ?LE ROM: ?  ?Active  Right ?01/18/2022 Left ?01/18/2022  ?Hip flexion 120 120  ?Hip extension 30 30  ?Hip abduction 45 45  ?Hip adduction 30 30  ?Hip internal rotation      ?Hip external rotation      ?Knee flexion 135 135  ?Knee extension 0 0  ?Ankle dorsiflexion 20 20  ?Ankle plantarflexion      ?Ankle inversion      ?Ankle eversion      ? (Blank rows = not tested) ?  ?  ? ?  ?LE MMT: ?  ?MMT Right ?01/18/2022 Left ?01/18/2022  ?Hip flexion 5/5 5/5  ?Hip extension 5/5 5/5  ?Hip abduction 5/5 5/5  ?Hip adduction 4/5 4/5  ?Hip internal rotation      ?Hip external rotation      ?Knee flexion 5/5 5/5  ?Knee extension 5/5 5/5  ?Ankle dorsiflexion 5/5 5/5  ?Ankle plantarflexion      ?Ankle inversion      ?Ankle eversion      ? (Blank rows = not tested) ?  ?  ?  ?LUMBAR SPECIAL TESTS:  ?FABER test: Negative and Thomas test: Positive ?  ?  ?TODAY'S TREATMENT  ? ?          01/20/22: ? ?Nu-Step Seat level 7 and arms 7 AND no resistance 5 min duration  ? ?30 deg incline single leg squat 1 x 10  ?-Slight increase in right hip and dorsiflexor  ? ?Omega Knee Extension #25 3 x 10  ? ?           Standing IT Band Stretch with 1 UE support 5 x 30 sec  ? ?           Single Leg Squat on RLE to 22 inch depth with BUE support 1 x 10  ?           - Pain in lower right side of glute and some pain in right lateral knee  ? ?          Single Leg Squat on RLE to 18 inch depth with BUE support 1 x 10  ?-Pain in lower right side of glute and some in right lateral  knee  ? ?Double Leg Squat to 18 inch height with BUE support 1 x 10  ? ?Pigeon Pose Stretch 3 x 30 sec   ? ? ? ?01/19/22: ? ?MANUAL THERAPY: ? ?Soft tissue massage of tib anterior ?Palpation of lateral to right knee quad tendon.   ? ?THEREX: ?Standing Dorsiflexion Stretch 3 x 60 sec  ?Forward Bend Stretch 3 x 30 sec  ?           Mini-Squat to 90/90 3 x 10  ?           Mini-Squat to 90/90 with #10 DB hold 1 x 10  ?           Hip Adduction 3 x 10  ? ?01/17/22 ? ?THEREX  ? ?Sidelying IT Band Stretch 2 x 60 sec  ?  ?  ?  ?PATIENT EDUCATION:  ?Education details: Form and technique for appropriate exercise and explanation of plan of care  ?Person educated: Patient ?Education method: Explanation, Demonstration, Verbal cues, and Handouts ?Education comprehension: verbalized understanding, returned demonstration, and verbal cues required ?  ?  ?HOME EXERCISE PROGRAM: ?Access Code: [email or web address] ?URL: [email or web address] ?Date: 01/24/2022 ?Prepared by: Ellin Goodieaniel Aerica Rincon ? ?Exercises ?- ITB Stretch at Wall  - 1 x daily - 7 x weekly - 1 sets - 5 reps - 30 hold ?- Supine Figure 4 Piriformis Stretch  - 1 x daily - 7 x weekly - 1 sets - 3 reps - 30 hold ?- Pigeon Pose  - 1 x daily - 7 x weekly - 1 sets - 3 reps - 30 hold ?- Sidelying Hip Adduction  - 1 x daily - 3 x weekly - 3 sets - 10 reps ?- Squat with Chair Touch  - 1 x daily - 3 x weekly - 3 sets - 10 reps ? ? ?ASSESSMENT: ?  ?CLINICAL IMPRESSION: ?Pt experienced little to no right knee pain during session indicating that symptoms and origin less likely to be patellar tendinopathy. She did experience in right hip in gluteal region during many of the exercises indicative of possible for gluteal issue. Gluteal stretch did resolve some of the pain in right hip and she was able to perform nearly all exercises without an increase in her pain with the exception of the single leg squats which aggravate right hip and right knee. Session focused on stretching painful areas and future sessions to focus on strengthening of surrounding musculature of hip and knee to continue to  resolve right sided hip and knee pain.  ?  ?  ?OBJECTIVE IMPAIRMENTS impaired flexibility and pain.  ?  ?ACTIVITY LIMITATIONS occupation.  ?  ?PERSONAL FACTORS 1 comorbidity: HTN  are also affecting patient's functional outcome.  ?  ?  ?

## 2022-01-26 ENCOUNTER — Encounter: Payer: Self-pay | Admitting: Physical Therapy

## 2022-01-26 ENCOUNTER — Ambulatory Visit: Payer: BC Managed Care – PPO | Admitting: Physical Therapy

## 2022-01-26 DIAGNOSIS — G8929 Other chronic pain: Secondary | ICD-10-CM

## 2022-01-26 DIAGNOSIS — M5441 Lumbago with sciatica, right side: Secondary | ICD-10-CM | POA: Diagnosis not present

## 2022-01-26 NOTE — Therapy (Signed)
?OUTPATIENT PHYSICAL THERAPY TREATMENT NOTE ? ? ?Patient Name: Amber Baker ?MRN: [id number] ?DOB:19-Nov-1965, 56 y.o., female ?Today's Date: 01/26/2022 ? ?PCP: Judy Pimple, MD ?REFERRING PROVIDER: Judy Pimple, MD ? ?END OF SESSION:  ? PT End of Session - 01/26/22 0900   ? ? Visit Number 3   ? Number of Visits 8   ? Date for PT Re-Evaluation 02/22/22   ? Authorization Type BCBS 2023   ? PT Start Time 0805   ? PT Stop Time 0845   ? PT Time Calculation (min) 40 min   ? Activity Tolerance Patient tolerated treatment well   ? Behavior During Therapy [facility] for tasks assessed/performed   ? ?  ?  ? ?  ? ? ? ? ?Past Medical History:  ?Diagnosis Date  ? Abdominal pain, generalized   ? Abnormal weight gain   ? Acute upper respiratory infections of unspecified site   ? Allergy   ? Arthritis   ? Feet - diagnosed by Podiatrist  ? Essential hypertension, benign   ? Gastroenteritis   ? Migraine, unspecified, without mention of intractable migraine without mention of status migrainosus   ? Routine general medical examination at a health care facility   ? ?Past Surgical History:  ?Procedure Laterality Date  ? CHOLECYSTECTOMY    ? OVARY SURGERY Left   ? ?Patient Active Problem List  ? Diagnosis Date Noted  ? Low back pain 12/20/2021  ? Prediabetes 07/12/2020  ? History of COVID-19 07/12/2020  ? Fatigue 08/29/2016  ? Fecal incontinence 07/25/2016  ? Chronic paronychia of finger 07/25/2016  ? Encounter for routine gynecological examination 05/23/2016  ? Routine general medical examination at a health care facility 02/14/2013  ? Allergic rhinitis 02/14/2013  ? Migraine headache 11/27/2007  ? Essential hypertension, benign 08/10/2007  ? ? ?REFERRING DIAG: Right sided low back, hip, and knee pain  ? ?THERAPY DIAG:  ?Chronic right-sided low back pain with right-sided sciatica ? ?PERTINENT HISTORY: Dr. Roxy Manns 12/21/21 ?  ?  ?CLINICAL DATA:  Right hip pain radiating into right anterior lower ?leg. ?  ?EXAM: ?LUMBAR SPINE - COMPLETE 4+  VIEW ?  ?COMPARISON:  None. ?  ?FINDINGS: ?Five non-rib-bearing lumbar vertebral bodies. ?  ?\Mild facet arthropathy at the L5-S1 level. There is no evidence ?of lumbar spine fracture. Alignment is normal. Mild intervertebral ?disc space narrowing at the L5-S1 level. ?  ?Right upper quadrant surgical clips. ?  ?IMPRESSION: ?No acute displaced fracture or traumatic listhesis of the lumbar ?spine. ? ?PRECAUTIONS: None  ? ?SUBJECTIVE: Pt reports an improvement in her pain. She thinks that it might have been due to increased pain in  ? ?PAIN:  ?Are you having pain? Yes: NPRS scale: 2/10 ?Pain location: Right tib anterior  ?Pain description: Lambert Mody ?Aggravating factors: Transferring from sit to stand  ?Relieving factors: Alieve  ? ? ?OBJECTIVE:  ?  ?Red Flags  ?  ?- Cauda Equina Syndrome: Negative to all symptoms  ?Bladder/bowel dysfunction ?Saddle anesthesia  ?Sexual dysfunction ?Possible neurological deficits in the lower limb (motor or sensory loss, reflex change) ?  ?  ?  ?            VITALS: 160/100 HR 86 SpO2 100 ?               ?DIAGNOSTIC FINDINGS:  ?Dr. Roxy Manns 12/21/21 ?  ?  ?CLINICAL DATA:  Right hip pain radiating into right anterior lower ?leg. ?  ?EXAM: ?LUMBAR SPINE - COMPLETE 4+  VIEW ?  ?COMPARISON:  None. ?  ?FINDINGS: ?Five non-rib-bearing lumbar vertebral bodies. ?  ?\Mild facet arthropathy at the L5-S1 level. There is no evidence ?of lumbar spine fracture. Alignment is normal. Mild intervertebral ?disc space narrowing at the L5-S1 level. ?  ?Right upper quadrant surgical clips. ?  ?IMPRESSION: ?No acute displaced fracture or traumatic listhesis of the lumbar ?spine. ?  ?PATIENT SURVEYS:  ?FOTO 62/72 ?  ?SCREENING FOR RED FLAGS: ?Bowel or bladder incontinence: No ?Spinal tumors: No ?Cauda equina syndrome: No ?Compression fracture: No ?Abdominal aneurysm: No ?  ?COGNITION: ?          Overall cognitive status: Within functional limits for tasks assessed               ?           ?SENSATION: ?WFL ?   ?MUSCLE LENGTH: ?Hamstrings: Right 70 deg; Left 70 deg ?Thomas test:  ?  ?POSTURE:  ?Normal posture  ?  ?PALPATION: ?Lateral condyle of right knee and  ?  ?LUMBAR ROM:  ?  ?Active  A/PROM  ?01/18/2022  ?Flexion 100%  ?Extension 100%  ?Right lateral flexion 100%*  ?Left lateral flexion 100%  ?Right rotation 100%  ?Left rotation 100%  ? (Blank rows = not tested) ?  ?LE ROM: ?  ?Active  Right ?01/18/2022 Left ?01/18/2022  ?Hip flexion 120 120  ?Hip extension 30 30  ?Hip abduction 45 45  ?Hip adduction 30 30  ?Hip internal rotation      ?Hip external rotation      ?Knee flexion 135 135  ?Knee extension 0 0  ?Ankle dorsiflexion 20 20  ?Ankle plantarflexion      ?Ankle inversion      ?Ankle eversion      ? (Blank rows = not tested) ?  ?  ? ?  ?LE MMT: ?  ?MMT Right ?01/18/2022 Left ?01/18/2022  ?Hip flexion 5/5 5/5  ?Hip extension 5/5 5/5  ?Hip abduction 5/5 5/5  ?Hip adduction 4/5 4/5  ?Hip internal rotation      ?Hip external rotation      ?Knee flexion 5/5 5/5  ?Knee extension 5/5 5/5  ?Ankle dorsiflexion 5/5 5/5  ?Ankle plantarflexion      ?Ankle inversion      ?Ankle eversion      ? (Blank rows = not tested) ?  ?  ?  ?LUMBAR SPECIAL TESTS:  ?FABER test: Negative and Thomas test: Positive ?  ?  ?TODAY'S TREATMENT  ? ?         01/26/22: ? ?         Nu-Step Resistance Level 4 with seat at 4  ?         OMEGA knee extension #25 3 x 10  ?         OMEGA knee flexion #35 3 x 10  ?         Lateral Step Downs on 1 ft step with 1 UE support 3 x 10  ?         Front Step Downs on 1 ft step with 1 UE support 3 x 10  ?         OMEGA Leg Press #105 3 x 10 (seat on peg 4)  ?        Pigeon Pose 4 x 60 sec  ?        Standing HS Stretch 4 x 60 sec  ? ? ?  01/24/22: ? ?Nu-Step Seat level 7 and arms 7 AND no resistance 5 min duration  ? ?30 deg incline single leg squat 1 x 10  ?-Slight increase in right hip and dorsiflexor  ? ?Omega Knee Extension #25 3 x 10  ? ?           Standing IT Band Stretch with 1 UE support 5 x 30 sec  ? ?            Single Leg Squat on RLE to 22 inch depth with BUE support 1 x 10  ?           - Pain in lower right side of glute and some pain in right lateral knee  ? ?          Single Leg Squat on RLE to 18 inch depth with BUE support 1 x 10  ?-Pain in lower right side of glute and some in right lateral knee  ? ?Double Leg Squat to 18 inch height with BUE support 1 x 10  ? ?Pigeon Pose Stretch 3 x 30 sec  ? ? ? ?01/19/22: ? ?MANUAL THERAPY: ? ?Soft tissue massage of tib anterior ?Palpation of lateral to right knee quad tendon.   ? ?THEREX: ?Standing Dorsiflexion Stretch 3 x 60 sec  ?Forward Bend Stretch 3 x 30 sec  ?           Mini-Squat to 90/90 3 x 10  ?           Mini-Squat to 90/90 with #10 DB hold 1 x 10  ?           Hip Adduction 3 x 10  ? ? ?  ?  ?PATIENT EDUCATION:  ?Education details: Form and technique for appropriate exercise and explanation of plan of care  ?Person educated: Patient ?Education method: Explanation, Demonstration, Verbal cues, and Handouts ?Education comprehension: verbalized understanding, returned demonstration, and verbal cues required ?  ?  ?HOME EXERCISE PROGRAM: ?Access Code: [email or web address] ?URL: [email or web address] ?Date: 01/26/2022 ?Prepared by: Ellin Goodie ? ?Exercises ?- ITB Stretch at Wall  - 1 x daily - 7 x weekly - 1 sets - 5 reps - 30 hold ?- Supine Figure 4 Piriformis Stretch  - 1 x daily - 7 x weekly - 1 sets - 3 reps - 30 hold ?- Pigeon Pose  - 1 x daily - 7 x weekly - 1 sets - 3 reps - 30 hold ?- Sidelying Hip Adduction  - 1 x daily - 3 x weekly - 3 sets - 10 reps ?- Lateral Step Down  - 1 x daily - 3 x weekly - 3 sets - 10 reps ?- Forward Step Down  - 1 x daily - 3 x weekly - 3 sets - 10 reps ? ? ?ASSESSMENT: ?  ?CLINICAL IMPRESSION: ? ?Pt exhibits an improvement in RLE strength with ability to perform eccentric step down and lateral step downs with little to no deviations in crononal or saggital plane. She continues to demonstrate excellent response to exercise with  no increase in pain despite increased resistance. Pt instructed on delayed onset muscle soreness and what to expect in terms of muscle response and to take a rest day if DOMS continues with activity. Pt's

## 2022-01-31 ENCOUNTER — Encounter: Payer: Self-pay | Admitting: Physical Therapy

## 2022-01-31 ENCOUNTER — Ambulatory Visit: Payer: BC Managed Care – PPO | Admitting: Physical Therapy

## 2022-01-31 DIAGNOSIS — G8929 Other chronic pain: Secondary | ICD-10-CM

## 2022-01-31 DIAGNOSIS — M5441 Lumbago with sciatica, right side: Secondary | ICD-10-CM | POA: Diagnosis not present

## 2022-01-31 NOTE — Therapy (Signed)
?OUTPATIENT PHYSICAL THERAPY TREATMENT NOTE ? ? ?Patient Name: Amber Baker ?MRN: [id number] ?DOB:06/23/1966, 56 y.o., female ?Today's Date: 01/31/2022 ? ?PCP: Judy Pimpleower, Marne A, MD ?REFERRING PROVIDER: Judy Pimpleower, Marne A, MD ? ?END OF SESSION:  ? PT End of Session - 01/31/22 0856   ? ? Visit Number 5   ? Number of Visits 8   ? Date for PT Re-Evaluation 02/22/22   ? Authorization Type BCBS [id number]   ? PT Start Time [phone]   ? PT Stop Time 0930   ? PT Time Calculation (min) 40 min   ? Activity Tolerance Patient tolerated treatment well   ? [facility]/per[facility]   ? ?  ?  ? ?  ? ? ? ? ?Past Medical History:  ?Diagnosis Date  ? Abdominal pain, generalized   ? Abnormal weight gain   ? Acute upper respiratory infections of unspecified site   ? Allergy   ? Arthritis   ? Feet - diagnosed by Podiatrist  ? Essential hypertension, benign   ? Gastroenteritis   ? Migraine, unspecified, without mention of intractable migraine without mention of status migrainosus   ? Routine general medical examination at a health care facility   ? ?Past Surgical History:  ?Procedure Laterality Date  ? CHOLECYSTECTOMY    ? OVARY SURGERY Left   ? ?Patient Active Problem List  ? Diagnosis Date Noted  ? Low back pain 12/20/2021  ? Prediabetes 07/12/2020  ? History of COVID-19 07/12/2020  ? Fatigue 08/29/2016  ? Fecal incontinence 07/25/2016  ? Chronic paronychia of finger 07/25/2016  ? Encounter for routine gynecological examination 05/23/2016  ? Routine general medical examination at a health care facility 02/14/2013  ? Allergic rhinitis 02/14/2013  ? Migraine headache 11/27/2007  ? Essential hypertension, benign 08/10/2007  ? ? ?REFERRING DIAG: Right sided low back, hip, and knee pain  ? ?THERAPY DIAG:  ?Chronic right-sided low back pain with right-sided sciatica ? ?PERTINENT HISTORY: Dr. Roxy MannsMarne Tower 12/21/21 ?  ?  ?CLINICAL DATA:  Right hip pain radiating into right anterior lower ?leg. ?  ?EXAM: ?LUMBAR SPINE - COMPLETE 4+  VIEW ?  ?COMPARISON:  None. ?  ?FINDINGS: ?Five non-rib-bearing lumbar vertebral bodies. ?  ?\Mild facet arthropathy at the L5-S1 level. There is no evidence ?of lumbar spine fracture. Alignment is normal. Mild intervertebral ?disc space narrowing at the L5-S1 level. ?  ?Right upper quadrant surgical clips. ?  ?IMPRESSION: ?No acute displaced fracture or traumatic listhesis of the lumbar ?spine. ? ?PRECAUTIONS: None  ? ?SUBJECTIVE: She reports increased pain in lateral right knee and hip. Feels this mostly when standing from a seated position and placing pressure on knee. She reports struggling with side and front step downs and  ? ?PAIN:  ?Are you having pain? Yes: NPRS scale: 2/10 ?Pain location: Right tib anterior  ?Pain description: Lambert ModySharp ?Aggravating factors: Transferring from sit to stand  ?Relieving factors: Alieve  ? ? ?OBJECTIVE:  ?  ?Red Flags  ?  ?- Cauda Equina Syndrome: Negative to all symptoms  ?Bladder/bowel dysfunction ?Saddle anesthesia  ?Sexual dysfunction ?Possible neurological deficits in the lower limb (motor or sensory loss, reflex change) ?  ?  ?  ?            VITALS: 160/100 HR 86 SpO2 100 ?               ?DIAGNOSTIC FINDINGS:  ?Dr. Roxy MannsMarne Tower 12/21/21 ?  ?  ?CLINICAL DATA:  Right hip  pain radiating into right anterior lower ?leg. ?  ?EXAM: ?LUMBAR SPINE - COMPLETE 4+ VIEW ?  ?COMPARISON:  None. ?  ?FINDINGS: ?Five non-rib-bearing lumbar vertebral bodies. ?  ?\Mild facet arthropathy at the L5-S1 level. There is no evidence ?of lumbar spine fracture. Alignment is normal. Mild intervertebral ?disc space narrowing at the L5-S1 level. ?  ?Right upper quadrant surgical clips. ?  ?IMPRESSION: ?No acute displaced fracture or traumatic listhesis of the lumbar ?spine. ?  ?PATIENT SURVEYS:  ?FOTO 62/72 ?  ?SCREENING FOR RED FLAGS: ?Bowel or bladder incontinence: No ?Spinal tumors: No ?Cauda equina syndrome: No ?Compression fracture: No ?Abdominal aneurysm: No ?  ?COGNITION: ?          Overall cognitive  status: Within functional limits for tasks assessed               ?           ?SENSATION: ?WFL ?  ?MUSCLE LENGTH: ?Hamstrings: Right 70 deg; Left 70 deg ?Thomas test:  ?  ?POSTURE:  ?Normal posture  ?  ?PALPATION: ?Lateral condyle of right knee and  ?  ?LUMBAR ROM:  ?  ?Active  A/PROM  ?01/18/2022  ?Flexion 100%  ?Extension 100%  ?Right lateral flexion 100%*  ?Left lateral flexion 100%  ?Right rotation 100%  ?Left rotation 100%  ? (Blank rows = not tested) ?  ?LE ROM: ?  ?Active  Right ?01/18/2022 Left ?01/18/2022  ?Hip flexion 120 120  ?Hip extension 30 30  ?Hip abduction 45 45  ?Hip adduction 30 30  ?Hip internal rotation      ?Hip external rotation      ?Knee flexion 135 135  ?Knee extension 0 0  ?Ankle dorsiflexion 20 20  ?Ankle plantarflexion      ?Ankle inversion      ?Ankle eversion      ? (Blank rows = not tested) ?  ?  ? ?  ?LE MMT: ?  ?MMT Right ?01/18/2022 Left ?01/18/2022  ?Hip flexion 5/5 5/5  ?Hip extension 5/5 5/5  ?Hip abduction 5/5 5/5  ?Hip adduction 4/5 4/5  ?Hip internal rotation      ?Hip external rotation      ?Knee flexion 5/5 5/5  ?Knee extension 5/5 5/5  ?Ankle dorsiflexion 5/5 5/5  ?Ankle plantarflexion      ?Ankle inversion      ?Ankle eversion      ? (Blank rows = not tested) ?  ?  ?  ?LUMBAR SPECIAL TESTS:  ?FABER test: Negative and Thomas test: Positive ?  ?  ?TODAY'S TREATMENT  ? ?      01/31/22: ?    Nu-Step Resistance Level 1 with seat at 8 and arms at 8     ?    Supine Figure 4 Stretch 3 x 30 sec  ?    Side Step Down from 28ft platform tapping on 6 inch yoga block 3 x 10  ?    Front Step Down from 1 ft platform tapping on 6 inch yoga block 3 x 10 ? ?         ?      ?     01/26/22: ? ?         Nu-Step Resistance Level 4 with seat at 4  ?         OMEGA knee extension #25 3 x 10  ?         OMEGA knee flexion #35 3 x 10  ?  Lateral Step Downs on 1 ft step with 1 UE support 3 x 10  ?         Front Step Downs on 1 ft step with 1 UE support 3 x 10  ?         OMEGA Leg Press #105 3 x 10  (seat on peg 4)  ?        Pigeon Pose 4 x 60 sec  ?        Standing HS Stretch 4 x 60 sec  ? ? ?         01/24/22: ? ?Nu-Step Seat level 7 and arms 7 AND no resistance 5 min duration  ? ?30 deg incline single leg squat 1 x 10  ?-Slight increase in right hip and dorsiflexor  ? ?Omega Knee Extension #25 3 x 10  ? ?           Standing IT Band Stretch with 1 UE support 5 x 30 sec  ? ?           Single Leg Squat on RLE to 22 inch depth with BUE support 1 x 10  ?           - Pain in lower right side of glute and some pain in right lateral knee  ? ?          Single Leg Squat on RLE to 18 inch depth with BUE support 1 x 10  ?-Pain in lower right side of glute and some in right lateral knee  ? ?Double Leg Squat to 18 inch height with BUE support 1 x 10  ? ?Pigeon Pose Stretch 3 x 30 sec  ? ? ?  ?PATIENT EDUCATION:  ?Education details: Form and technique for appropriate exercise and explanation of plan of care  ?Person educated: Patient ?Education method: Explanation, Demonstration, Verbal cues, and Handouts ?Education comprehension: verbalized understanding, returned demonstration, and verbal cues required ?  ?  ?HOME EXERCISE PROGRAM: ?Access Code: [email or web address] ?URL: [email or web address] ?Date: 01/31/2022 ?Prepared by: Ellin Goodie ? ?Exercises ?- ITB Stretch at Wall  - 1 x daily - 7 x weekly - 1 sets - 5 reps - 30 hold ?- Supine Figure 4 Piriformis Stretch  - 1 x daily - 7 x weekly - 1 sets - 3 reps - 30 hold ?- Pigeon Pose  - 1 x daily - 7 x weekly - 1 sets - 3 reps - 30 hold ?- Sidelying Hip Adduction  - 1 x daily - 3 x weekly - 3 sets - 10 reps ?- Lateral Step Down  - 1 x daily - 3 x weekly - 3 sets - 10 reps ?- Forward Step Down  - 1 x daily - 3 x weekly - 3 sets - 10 reps ?- Standing Single Leg Heel Raise  - 1 x daily - 3 x weekly - 3 sets - 10 reps ? ? ?ASSESSMENT: ?  ?CLINICAL IMPRESSION: ? ?Pt demonstrates in pain response to revision of HEP to include decreased depth with step downs and with decreased  pressure with supine figure 4 stretch. She was able to perform all exercises without an increase in her knee or hip pain. She continues to feel discomfort in lateral hip and knee albeit less than initial

## 2022-02-02 ENCOUNTER — Encounter: Payer: Self-pay | Admitting: Physical Therapy

## 2022-02-02 ENCOUNTER — Ambulatory Visit: Payer: BC Managed Care – PPO | Admitting: Physical Therapy

## 2022-02-02 DIAGNOSIS — M5441 Lumbago with sciatica, right side: Secondary | ICD-10-CM | POA: Diagnosis not present

## 2022-02-02 DIAGNOSIS — G8929 Other chronic pain: Secondary | ICD-10-CM

## 2022-02-02 NOTE — Therapy (Signed)
?OUTPATIENT PHYSICAL THERAPY TREATMENT NOTE ? ? ?Patient Name: Amber Baker ?MRN: [id number] ?DOB:02/02/1966, 56 y.o., female ?Today's Date: 02/02/2022 ? ?PCP: Judy Pimple, MD ?REFERRING PROVIDER: Judy Pimple, MD ? ?END OF SESSION:  ? PT End of Session - 02/02/22 8850   ? ? Visit Number 6   ? Number of Visits 8   ? Date for PT Re-Evaluation 02/22/22   ? Authorization Type BCBS 2023   ? PT Start Time 0805   ? PT Stop Time 0845   ? PT Time Calculation (min) 40 min   ? Activity Tolerance Patient tolerated treatment well   ? Behavior During Therapy [facility] for tasks assessed/performed   ? ?  ?  ? ?  ? ? ? ? ?Past Medical History:  ?Diagnosis Date  ? Abdominal pain, generalized   ? Abnormal weight gain   ? Acute upper respiratory infections of unspecified site   ? Allergy   ? Arthritis   ? Feet - diagnosed by Podiatrist  ? Essential hypertension, benign   ? Gastroenteritis   ? Migraine, unspecified, without mention of intractable migraine without mention of status migrainosus   ? Routine general medical examination at a health care facility   ? ?Past Surgical History:  ?Procedure Laterality Date  ? CHOLECYSTECTOMY    ? OVARY SURGERY Left   ? ?Patient Active Problem List  ? Diagnosis Date Noted  ? Low back pain 12/20/2021  ? Prediabetes 07/12/2020  ? History of COVID-19 07/12/2020  ? Fatigue 08/29/2016  ? Fecal incontinence 07/25/2016  ? Chronic paronychia of finger 07/25/2016  ? Encounter for routine gynecological examination 05/23/2016  ? Routine general medical examination at a health care facility 02/14/2013  ? Allergic rhinitis 02/14/2013  ? Migraine headache 11/27/2007  ? Essential hypertension, benign 08/10/2007  ? ? ?REFERRING DIAG: Right sided low back, hip, and knee pain  ? ?THERAPY DIAG:  ?Chronic right-sided low back pain with right-sided sciatica ? ?PERTINENT HISTORY: Dr. Roxy Manns 12/21/21 ?  ?  ?CLINICAL DATA:  Right hip pain radiating into right anterior lower ?leg. ?  ?EXAM: ?LUMBAR SPINE - COMPLETE 4+  VIEW ?  ?COMPARISON:  None. ?  ?FINDINGS: ?Five non-rib-bearing lumbar vertebral bodies. ?  ?\Mild facet arthropathy at the L5-S1 level. There is no evidence ?of lumbar spine fracture. Alignment is normal. Mild intervertebral ?disc space narrowing at the L5-S1 level. ?  ?Right upper quadrant surgical clips. ?  ?IMPRESSION: ?No acute displaced fracture or traumatic listhesis of the lumbar ?spine. ? ?PRECAUTIONS: None  ? ?SUBJECTIVE: Pt reports decreased pain with exception of last night where she felt pain in her right knee but a small amount of it after performing exercises.  ? ?PAIN:  ?Are you having pain? Yes: NPRS scale: 2/10 ?Pain location: Right tib anterior  ?Pain description: Lambert Mody ?Aggravating factors: Transferring from sit to stand  ?Relieving factors: Alieve  ? ? ?OBJECTIVE:  ?  ?Red Flags  ?  ?- Cauda Equina Syndrome: Negative to all symptoms  ?Bladder/bowel dysfunction ?Saddle anesthesia  ?Sexual dysfunction ?Possible neurological deficits in the lower limb (motor or sensory loss, reflex change) ?  ?  ?  ?            VITALS: 160/100 HR 86 SpO2 100 ?               ?DIAGNOSTIC FINDINGS:  ?Dr. Roxy Manns 12/21/21 ?  ?  ?CLINICAL DATA:  Right hip pain radiating into right anterior lower ?leg. ?  ?  EXAM: ?LUMBAR SPINE - COMPLETE 4+ VIEW ?  ?COMPARISON:  None. ?  ?FINDINGS: ?Five non-rib-bearing lumbar vertebral bodies. ?  ?\Mild facet arthropathy at the L5-S1 level. There is no evidence ?of lumbar spine fracture. Alignment is normal. Mild intervertebral ?disc space narrowing at the L5-S1 level. ?  ?Right upper quadrant surgical clips. ?  ?IMPRESSION: ?No acute displaced fracture or traumatic listhesis of the lumbar ?spine. ?  ?PATIENT SURVEYS:  ?FOTO 62/72 ?  ?SCREENING FOR RED FLAGS: ?Bowel or bladder incontinence: No ?Spinal tumors: No ?Cauda equina syndrome: No ?Compression fracture: No ?Abdominal aneurysm: No ?  ?COGNITION: ?          Overall cognitive status: Within functional limits for tasks assessed                ?           ?SENSATION: ?WFL ?  ?MUSCLE LENGTH: ?Hamstrings: Right 70 deg; Left 70 deg ?Thomas test:  ?  ?POSTURE:  ?Normal posture  ?  ?PALPATION: ?Lateral condyle of right knee and  ?  ?LUMBAR ROM:  ?  ?Active  A/PROM  ?01/18/2022  ?Flexion 100%  ?Extension 100%  ?Right lateral flexion 100%*  ?Left lateral flexion 100%  ?Right rotation 100%  ?Left rotation 100%  ? (Blank rows = not tested) ?  ?LE ROM: ?  ?Active  Right ?01/18/2022 Left ?01/18/2022  ?Hip flexion 120 120  ?Hip extension 30 30  ?Hip abduction 45 45  ?Hip adduction 30 30  ?Hip internal rotation      ?Hip external rotation      ?Knee flexion 135 135  ?Knee extension 0 0  ?Ankle dorsiflexion 20 20  ?Ankle plantarflexion      ?Ankle inversion      ?Ankle eversion      ? (Blank rows = not tested) ?  ?  ? ?  ?LE MMT: ?  ?MMT Right ?01/18/2022 Left ?01/18/2022  ?Hip flexion 5/5 5/5  ?Hip extension 5/5 5/5  ?Hip abduction 5/5 5/5  ?Hip adduction 4/5 4/5  ?Hip internal rotation      ?Hip external rotation      ?Knee flexion 5/5 5/5  ?Knee extension 5/5 5/5  ?Ankle dorsiflexion 5/5 5/5  ?Ankle plantarflexion      ?Ankle inversion      ?Ankle eversion      ? (Blank rows = not tested) ?  ?  ?  ?LUMBAR SPECIAL TESTS:  ?FABER test: Negative and Thomas test: Positive ?  ?  ?TODAY'S TREATMENT  ? ?    02/02/22: ?    Nu-Step Resistance level 4 for 10 min  ?    Runner Step Ups on 1 ft platform with 6 lb MB shoulder press 3 x 10  ?    Single Leg Stance 3x cone tap  ?    Single Leg Stance Chest Press into trampoline with 6 lb MB  1 x 10  ?    Single Leg Stance [place] into trampoline with 6 lb MB 1 x 10  ?    Matrix Hip Abduction with #40 3 x 10  ?    Matrix Hip Adduction with #40 3 x 10  ?    TRX Lunges  ?    -min VC to adjust foot placement from ER to IR  ?    TRX Squats to 90 deg knee and hip flexion 3 x 10  ? ?    01/31/22: ?    Nu-Step  Resistance Level 1 with seat at 8 and arms at 8     ?    Supine Figure 4 Stretch 3 x 30 sec  ?    Side Step Down from 36ft  platform tapping on 6 inch yoga block 3 x 10  ?    Front Step Down from 1 ft platform tapping on 6 inch yoga block 3 x 10 ? ?         ?      ?     01/26/22: ? ?         Nu-Step Resistance Level 4 with seat at 4  ?         OMEGA knee extension #25 3 x 10  ?         OMEGA knee flexion #35 3 x 10  ?         Lateral Step Downs on 1 ft step with 1 UE support 3 x 10  ?         Front Step Downs on 1 ft step with 1 UE support 3 x 10  ?         OMEGA Leg Press #105 3 x 10 (seat on peg 4)  ?        Pigeon Pose 4 x 60 sec  ?        Standing HS Stretch 4 x 60 sec  ? ? ?         01/24/22: ? ?Nu-Step Seat level 7 and arms 7 AND no resistance 5 min duration  ? ?30 deg incline single leg squat 1 x 10  ?-Slight increase in right hip and dorsiflexor  ? ?Omega Knee Extension #25 3 x 10  ? ?           Standing IT Band Stretch with 1 UE support 5 x 30 sec  ? ?           Single Leg Squat on RLE to 22 inch depth with BUE support 1 x 10  ?           - Pain in lower right side of glute and some pain in right lateral knee  ? ?          Single Leg Squat on RLE to 18 inch depth with BUE support 1 x 10  ?-Pain in lower right side of glute and some in right lateral knee  ? ?Double Leg Squat to 18 inch height with BUE support 1 x 10  ? ?Pigeon Pose Stretch 3 x 30 sec  ? ? ?  ?PATIENT EDUCATION:  ?Education details: Form and technique for appropriate exercise and explanation of plan of care  ?Person educated: Patient ?Education method: Explanation, Demonstration, Verbal cues, and Handouts ?Education comprehension: verbalized understanding, returned demonstration, and verbal cues required ?  ?  ?HOME EXERCISE PROGRAM: ?Access Code: [email or web address] ?URL: [email or web address] ?Date: 01/31/2022 ?Prepared by: Ellin Goodie ? ?Exercises ?- ITB Stretch at Wall  - 1 x daily - 7 x weekly - 1 sets - 5 reps - 30 hold ?- Supine Figure 4 Piriformis Stretch  - 1 x daily - 7 x weekly - 1 sets - 3 reps - 30 hold ?- Pigeon Pose  - 1 x daily - 7 x weekly - 1  sets - 3 reps - 30 hold ?- Sidelying Hip Adduction  - 1 x daily - 3 x weekly - 3 sets - 10 reps ?- Lateral Step Down  -  1 x daily - 3 x weekly - 3 sets - 10 reps ?- Forward Step Down  - 1 x daily - 3 x w

## 2022-02-07 ENCOUNTER — Encounter: Payer: Self-pay | Admitting: Physical Therapy

## 2022-02-07 ENCOUNTER — Ambulatory Visit: Payer: BC Managed Care – PPO | Attending: Family Medicine | Admitting: Physical Therapy

## 2022-02-07 DIAGNOSIS — G8929 Other chronic pain: Secondary | ICD-10-CM | POA: Insufficient documentation

## 2022-02-07 DIAGNOSIS — M5441 Lumbago with sciatica, right side: Secondary | ICD-10-CM | POA: Insufficient documentation

## 2022-02-07 NOTE — Therapy (Signed)
OUTPATIENT PHYSICAL THERAPY TREATMENT NOTE   Patient Name: Amber Baker MRN: [id number] DOB:[birth year], 56 y.o., female Today's Date: 02/07/2022  PCP: Judy Pimple, MD REFERRING PROVIDER: Judy Pimple, MD  END OF SESSION:   PT End of Session - 02/07/22 0912     Visit Number 7    Number of Visits 8    Date for PT Re-Evaluation 02/22/22    Authorization Type BCBS 2023    PT Start Time 0805    PT Stop Time 0845    PT Time Calculation (min) 40 min    Activity Tolerance Patient tolerated treatment well    Behavior During Therapy WFL for tasks assessed/performed               Past Medical History:  Diagnosis Date   Abdominal pain, generalized    Abnormal weight gain    Acute upper respiratory infections of unspecified site    Allergy    Arthritis    Feet - diagnosed by Podiatrist   Essential hypertension, benign    Gastroenteritis    Migraine, unspecified, without mention of intractable migraine without mention of status migrainosus    Routine general medical examination at a health care facility    Past Surgical History:  Procedure Laterality Date   CHOLECYSTECTOMY     OVARY SURGERY Left    Patient Active Problem List   Diagnosis Date Noted   Low back pain 12/20/2021   Prediabetes 07/12/2020   History of COVID-19 07/12/2020   Fatigue 08/29/2016   Fecal incontinence 07/25/2016   Chronic paronychia of finger 07/25/2016   Encounter for routine gynecological examination 05/23/2016   Routine general medical examination at a health care facility 02/14/2013   Allergic rhinitis 02/14/2013   Migraine headache 11/27/2007   Essential hypertension, benign 08/10/2007    REFERRING DIAG: Right sided low back, hip, and knee pain   THERAPY DIAG:  Chronic right-sided low back pain with right-sided sciatica  PERTINENT HISTORY: Dr. Roxy Manns 12/21/21     CLINICAL DATA:  Right hip pain radiating into right anterior lower leg.   EXAM: LUMBAR SPINE - COMPLETE 4+  VIEW   COMPARISON:  None.   FINDINGS: Five non-rib-bearing lumbar vertebral bodies.   \Mild facet arthropathy at the L5-S1 level. There is no evidence of lumbar spine fracture. Alignment is normal. Mild intervertebral disc space narrowing at the L5-S1 level.   Right upper quadrant surgical clips.   IMPRESSION: No acute displaced fracture or traumatic listhesis of the lumbar spine.  PRECAUTIONS: None   SUBJECTIVE: Pt reports that she felt some soreness after last session to the point where she   PAIN:  Are you having pain? Yes: NPRS scale: 0/10 Pain location: Right tib anterior  Pain description: Sharp Aggravating factors: Transferring from sit to stand  Relieving factors: Alieve    OBJECTIVE:    Red Flags    - Cauda Equina Syndrome: Negative to all symptoms  Bladder/bowel dysfunction Saddle anesthesia  Sexual dysfunction Possible neurological deficits in the lower limb (motor or sensory loss, reflex change)                   VITALS: 160/100 HR 86 SpO2 100                DIAGNOSTIC FINDINGS:  Dr. Roxy Manns 12/21/21     CLINICAL DATA:  Right hip pain radiating into right anterior lower leg.   EXAM: LUMBAR SPINE - COMPLETE 4+ VIEW   COMPARISON:  None.   FINDINGS: Five non-rib-bearing lumbar vertebral bodies.   \Mild facet arthropathy at the L5-S1 level. There is no evidence of lumbar spine fracture. Alignment is normal. Mild intervertebral disc space narrowing at the L5-S1 level.   Right upper quadrant surgical clips.   IMPRESSION: No acute displaced fracture or traumatic listhesis of the lumbar spine.   PATIENT SURVEYS:  FOTO 62/72   SCREENING FOR RED FLAGS: Bowel or bladder incontinence: No Spinal tumors: No Cauda equina syndrome: No Compression fracture: No Abdominal aneurysm: No   COGNITION:           Overall cognitive status: Within functional limits for tasks assessed                          SENSATION: WFL   MUSCLE  LENGTH: Hamstrings: Right 70 deg; Left 70 deg Thomas test:    POSTURE:  Normal posture    PALPATION: Lateral condyle of right knee and    LUMBAR ROM:    Active  A/PROM  01/18/2022  Flexion 100%  Extension 100%  Right lateral flexion 100%*  Left lateral flexion 100%  Right rotation 100%  Left rotation 100%   (Blank rows = not tested)   LE ROM:   Active  Right 01/18/2022 Left 01/18/2022  Hip flexion 120 120  Hip extension 30 30  Hip abduction 45 45  Hip adduction 30 30  Hip internal rotation      Hip external rotation      Knee flexion 135 135  Knee extension 0 0  Ankle dorsiflexion 20 20  Ankle plantarflexion      Ankle inversion      Ankle eversion       (Blank rows = not tested)        LE MMT:   MMT Right 01/18/2022 Left 01/18/2022  Hip flexion 5/5 5/5  Hip extension 5/5 5/5  Hip abduction 5/5 5/5  Hip adduction 4/5 4/5  Hip internal rotation      Hip external rotation      Knee flexion 5/5 5/5  Knee extension 5/5 5/5  Ankle dorsiflexion 5/5 5/5  Ankle plantarflexion      Ankle inversion      Ankle eversion       (Blank rows = not tested)       LUMBAR SPECIAL TESTS:  FABER test: Negative and Thomas test: Positive     TODAY'S TREATMENT   02/07/22: Nu-Step Resistance level 1 for 6 min  Lateral step down off 1 ft step with 1 UE support  -min VC to maintain neutral knee  Front step down off 77ft step with 1 UE support  -min VC to maintain neutral knee  Hip Adductor Stretch with PT providing overpressure 2 x 60 sec  Hip Abductor Stretch with PT providing overpressure 2 x 60 sec     HS Stretch with PT providing overpressure 2 x 60   Omega Hip Extension #75 3 x 10  Reverse Lunges with Sliders and 1 UE support 3 x 10      02/02/22:     Nu-Step Resistance level 4 for 10 min      Runner Step Ups on 1 ft platform with 6 lb MB shoulder press 3 x 10      Single Leg Stance 3x cone tap      Single Leg Stance Chest Press into trampoline with 6 lb MB  1  x 10  Single Leg Stance Over [place] into trampoline with 6 lb MB 1 x 10      Matrix Hip Abduction with #40 3 x 10      Matrix Hip Adduction with #40 3 x 10      TRX Lunges      -min VC to adjust foot placement from ER to IR      TRX Squats to 90 deg knee and hip flexion 3 x 10       01/31/22:     Nu-Step Resistance Level 1 with seat at 8 and arms at 8         Supine Figure 4 Stretch 3 x 30 sec      Side Step Down from 54ft platform tapping on 6 inch yoga block 3 x 10      Front Step Down from 1 ft platform tapping on 6 inch yoga block 3 x 10                   PATIENT EDUCATION:  Education details: Form and technique for appropriate exercise and explanation of plan of care  Person educated: Patient Education method: Explanation, Demonstration, Verbal cues, and Handouts Education comprehension: verbalized understanding, returned demonstration, and verbal cues required     HOME EXERCISE PROGRAM: Access Code: [email or web address] URL: [email or web address] Date: 01/31/2022 Prepared by: Ellin Goodie  Exercises - ITB Stretch at Wall  - 1 x daily - 7 x weekly - 1 sets - 5 reps - 30 hold - Supine Figure 4 Piriformis Stretch  - 1 x daily - 7 x weekly - 1 sets - 3 reps - 30 hold - Pigeon Pose  - 1 x daily - 7 x weekly - 1 sets - 3 reps - 30 hold - Sidelying Hip Adduction  - 1 x daily - 3 x weekly - 3 sets - 10 reps - Lateral Step Down  - 1 x daily - 3 x weekly - 3 sets - 10 reps - Forward Step Down  - 1 x daily - 3 x weekly - 3 sets - 10 reps - Standing Single Leg Heel Raise  - 1 x daily - 3 x weekly - 3 sets - 10 reps   ASSESSMENT:   CLINICAL IMPRESSION:  Pt continues to present close to pain free with activity with increased LE strength with completion of eccentric strengthening exercises. Pain also improved after performing stretches at beginning of session. She is nearing end of POC and goals will be reassessed next session to determine progress and readiness for discharge.           OBJECTIVE IMPAIRMENTS impaired flexibility and pain.    ACTIVITY LIMITATIONS occupation.    PERSONAL FACTORS 1 comorbidity: HTN  are also affecting patient's functional outcome.      REHAB POTENTIAL: Good   CLINICAL DECISION MAKING: Stable/uncomplicated   EVALUATION COMPLEXITY: Low     GOALS: Goals reviewed with patient? No   SHORT TERM GOALS: Target date: 02/01/2022   Pt will be independent with HEP in order to improve strength and balance in order to decrease fall risk and improve function at home and work. Baseline: NT  Goal status: INITIAL   2.  Pt will improve hip adduction strength to 5/5 to improve overall hip strength to decrease load on hip abductors to resolve lateral right hip pain.  Baseline: Hip Adduction R/L 4/4 Goal status: INITIAL       LONG TERM GOALS: Target date: 02/22/2022  Patient will have improved function and activity level as evidenced by an increase in FOTO score by 10 points or more.  Baseline: 62/72 Goal status: INITIAL   2.  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  Baseline: 3/10  Goal status: INITIAL         PLAN: PT FREQUENCY: 1-2x/week   PT DURATION: 8 weeks   PLANNED INTERVENTIONS: Therapeutic exercises, Therapeutic activity, Neuromuscular re-education, Patient/Family education, Joint mobilization, Dry Needling, Cryotherapy, Moist heat, and Manual therapy.   PLAN FOR NEXT SESSION: Reassess goals and HS focused exercises     Ellin Goodie PT, DPT  02/07/2022, 9:13 AM

## 2022-02-08 ENCOUNTER — Other Ambulatory Visit: Payer: Self-pay | Admitting: Family Medicine

## 2022-02-08 DIAGNOSIS — G43709 Chronic migraine without aura, not intractable, without status migrainosus: Secondary | ICD-10-CM

## 2022-02-08 DIAGNOSIS — I10 Essential (primary) hypertension: Secondary | ICD-10-CM

## 2022-02-09 ENCOUNTER — Ambulatory Visit: Payer: BC Managed Care – PPO | Admitting: Physical Therapy

## 2022-02-09 ENCOUNTER — Encounter: Payer: Self-pay | Admitting: Physical Therapy

## 2022-02-09 DIAGNOSIS — M5441 Lumbago with sciatica, right side: Secondary | ICD-10-CM | POA: Diagnosis not present

## 2022-02-09 DIAGNOSIS — G8929 Other chronic pain: Secondary | ICD-10-CM

## 2022-02-09 NOTE — Therapy (Addendum)
?OUTPATIENT PHYSICAL THERAPY PROGRESS NOTE/ Discharge Summary  ? ? ?Patient Name: Amber Baker ?MRN: [id number] ?DOB:04-Mar-1966, 55 y.o., female ?Today's Date: 02/09/2022 ? ?PCP: Abner Greenspan, MD ?REFERRING PROVIDER: Abner Greenspan, MD ? ?END OF SESSION:  ? PT End of Session - 02/09/22 1023   ? ? Visit Number 8   ? Number of Visits 8   ? Date for PT Re-Evaluation 02/22/22   ? Authorization Type BCBS 2023   ? PT Start Time [id number]   ? PT Stop Time 1005   ? PT Time Calculation (min) 30 min   ? Activity Tolerance Patient tolerated treatment well   ? Behavior During Therapy [facility] for tasks assessed/performed   ? ?  ?  ? ?  ? ? ? ? ? ?Past Medical History:  ?Diagnosis Date  ? Abdominal pain, generalized   ? Abnormal weight gain   ? Acute upper respiratory infections of unspecified site   ? Allergy   ? Arthritis   ? Feet - diagnosed by Podiatrist  ? Essential hypertension, benign   ? Gastroenteritis   ? Migraine, unspecified, without mention of intractable migraine without mention of status migrainosus   ? Routine general medical examination at a health care facility   ? ?Past Surgical History:  ?Procedure Laterality Date  ? CHOLECYSTECTOMY    ? OVARY SURGERY Left   ? ?Patient Active Problem List  ? Diagnosis Date Noted  ? Low back pain 12/20/2021  ? Prediabetes 07/12/2020  ? History of COVID-19 07/12/2020  ? Fatigue 08/29/2016  ? Fecal incontinence 07/25/2016  ? Chronic paronychia of finger 07/25/2016  ? Encounter for routine gynecological examination 05/23/2016  ? Routine general medical examination at a health care facility 02/14/2013  ? Allergic rhinitis 02/14/2013  ? Migraine headache 11/27/2007  ? Essential hypertension, benign 08/10/2007  ? ? ?REFERRING DIAG: Right sided low back, hip, and knee pain  ? ?THERAPY DIAG:  ?Chronic right-sided low back pain with right-sided sciatica ? ?PERTINENT HISTORY: Dr. Loura Pardon 12/21/21 ?  ?  ?CLINICAL DATA:  Right hip pain radiating into right anterior lower ?leg. ?  ?EXAM: ?LUMBAR  SPINE - COMPLETE 4+ VIEW ?  ?COMPARISON:  None. ?  ?FINDINGS: ?Five non-rib-bearing lumbar vertebral bodies. ?  ?\Mild facet arthropathy at the L5-S1 level. There is no evidence ?of lumbar spine fracture. Alignment is normal. Mild intervertebral ?disc space narrowing at the L5-S1 level. ?  ?Right upper quadrant surgical clips. ?  ?IMPRESSION: ?No acute displaced fracture or traumatic listhesis of the lumbar ?spine. ? ?PRECAUTIONS: None  ? ?SUBJECTIVE: Pt reports improvement in right hip pain, but she still has remaining right knee pain. She reports that right knee pain tends to increase to 3/10 especially after performing step exercises.  ? ?PAIN:  ?Are you having pain? Yes: NPRS scale: 0/10 ?Pain location: Right tib anterior  ?Pain description: Hervey Ard ?Aggravating factors: Transferring from sit to stand  ?Relieving factors: Alieve  ? ? ?OBJECTIVE:  ?  ?Red Flags  ?  ?- Cauda Equina Syndrome: Negative to all symptoms  ?Bladder/bowel dysfunction ?Saddle anesthesia  ?Sexual dysfunction ?Possible neurological deficits in the lower limb (motor or sensory loss, reflex change) ?  ?  ?  ?            VITALS: 160/100 HR 86 SpO2 100 ?               ?DIAGNOSTIC FINDINGS:  ?Dr. Loura Pardon 12/21/21 ?  ?  ?CLINICAL DATA:  Right  hip pain radiating into right anterior lower ?leg. ?  ?EXAM: ?LUMBAR SPINE - COMPLETE 4+ VIEW ?  ?COMPARISON:  None. ?  ?FINDINGS: ?Five non-rib-bearing lumbar vertebral bodies. ?  ?\Mild facet arthropathy at the L5-S1 level. There is no evidence ?of lumbar spine fracture. Alignment is normal. Mild intervertebral ?disc space narrowing at the L5-S1 level. ?  ?Right upper quadrant surgical clips. ?  ?IMPRESSION: ?No acute displaced fracture or traumatic listhesis of the lumbar ?spine. ?  ?PATIENT SURVEYS:  ?FOTO 62/72 ?  ?SCREENING FOR RED FLAGS: ?Bowel or bladder incontinence: No ?Spinal tumors: No ?Cauda equina syndrome: No ?Compression fracture: No ?Abdominal aneurysm: No ?  ?COGNITION: ?          Overall  cognitive status: Within functional limits for tasks assessed               ?           ?SENSATION: ?WFL ?  ?MUSCLE LENGTH: ?Hamstrings: Right 70 deg; Left 70 deg ?Thomas test:  ?  ?POSTURE:  ?Normal posture  ?  ?PALPATION: ?Lateral condyle of right knee and  ?  ?LUMBAR ROM:  ?  ?Active  A/PROM  ?01/18/2022  ?Flexion 100%  ?Extension 100%  ?Right lateral flexion 100%*  ?Left lateral flexion 100%  ?Right rotation 100%  ?Left rotation 100%  ? (Blank rows = not tested) ?  ?LE ROM: ?  ?Active  Right ?01/18/2022 Left ?01/18/2022  ?Hip flexion 120 120  ?Hip extension 30 30  ?Hip abduction 45 45  ?Hip adduction 30 30  ?Hip internal rotation      ?Hip external rotation      ?Knee flexion 135 135  ?Knee extension 0 0  ?Ankle dorsiflexion 20 20  ?Ankle plantarflexion      ?Ankle inversion      ?Ankle eversion      ? (Blank rows = not tested) ?  ?  ? ?  ?LE MMT: ?  ?MMT Right ?01/18/2022 Left ?01/18/2022  ?Hip flexion 5/5 5/5  ?Hip extension 5/5 5/5  ?Hip abduction 5/5 5/5  ?Hip adduction 4/5 4/5  ?Hip internal rotation      ?Hip external rotation      ?Knee flexion 5/5 5/5  ?Knee extension 5/5 5/5  ?Ankle dorsiflexion 5/5 5/5  ?Ankle plantarflexion      ?Ankle inversion      ?Ankle eversion      ? (Blank rows = not tested) ?  ?  ?  ?LUMBAR SPECIAL TESTS:  ?FABER test: Negative and Thomas test: Positive ?  ?  ?TODAY'S TREATMENT  ? ?02/09/22:  ?Nu-Step Resistance level 3 for 6 min  ?Hip MMT  ?-Hip Abduciton 5/5  ?-Hip Adduction 5/5  ? ?Prone Quad Stretch on RLE 3 x 30 sec  ?Hip Abduction with RTB 3 x 10  ?Reverse Lunges with sliders 3 x 10  ? ? ? ?02/07/22: ?Nu-Step Resistance level 1 for 6 min  ?Lateral step down off 1 ft step with 1 UE support  ?-min VC to maintain neutral knee  ?Front step down off 6ft step with 1 UE support  ?-min VC to maintain neutral knee  ?Hip Adductor Stretch with PT providing overpressure 2 x 60 sec  ?Hip Abductor Stretch with PT providing overpressure 2 x 60 sec     ?HS Stretch with PT providing overpressure  2 x 60  ? ?Omega Hip Extension #75 3 x 10  ?Reverse Lunges with Sliders and 1 UE support 3 x  10   ? ? ? 02/02/22: ?    Nu-Step Resistance level 4 for 10 min  ?    Runner Step Ups on 1 ft platform with 6 lb MB shoulder press 3 x 10  ?    Single Leg Stance 3x cone tap  ?    Single Leg Stance Chest Press into trampoline with 6 lb MB  1 x 10  ?    Single Leg Stance Over [place] into trampoline with 6 lb MB 1 x 10  ?    Matrix Hip Abduction with #40 3 x 10  ?    Matrix Hip Adduction with #40 3 x 10  ?    TRX Lunges  ?    -min VC to adjust foot placement from ER to IR  ?    TRX Squats to 90 deg knee and hip flexion 3 x 10  ? ? ?         ?      ?  ?PATIENT EDUCATION:  ?Education details: Form and technique for appropriate exercise and explanation of plan of care  ?Person educated: Patient ?Education method: Explanation, Demonstration, Verbal cues, and Handouts ?Education comprehension: verbalized understanding, returned demonstration, and verbal cues required ?  ?  ?HOME EXERCISE PROGRAM: ?Access Code: [email or web address] ?URL: [email or web address] ?Date: 02/09/2022 ?Prepared by: Bradly Chris ? ?Exercises ?- ITB Stretch at Wall  - 1 x daily - 7 x weekly - 1 sets - 5 reps - 30 hold ?- Prone Quadriceps Stretch with Strap  - 1 x daily - 3 x weekly - 1 sets - 3 reps - 30 hold ?- Supine Figure 4 Piriformis Stretch  - 1 x daily - 7 x weekly - 1 sets - 3 reps - 30 hold ?- Pigeon Pose  - 1 x daily - 7 x weekly - 1 sets - 3 reps - 30 hold ?- Sidelying Hip Adduction  - 1 x daily - 3 x weekly - 3 sets - 10 reps ?- Sidelying Hip Abduction with Resistance at Thighs  - 1 x daily - 3 x weekly - 3 sets - 10 reps ?- Standing Single Leg Heel Raise  - 1 x daily - 3 x weekly - 3 sets - 10 reps ?- Reverse Lunge on Slider  - 1 x daily - 3 x weekly - 3 sets - 10 reps ? ? ?ASSESSMENT: ?  ?CLINICAL IMPRESSION: ? ?Pt has reached all remaining goals, and she is now ready for discharge. 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 HEP modified to included open chain hip abduction and graded reverse lunges to avoid exacerbating knee pain. S

## 2022-02-09 NOTE — Telephone Encounter (Signed)
Last OV was 12/20/21 for leg/hip pain. Imitrex last filled on 01/19/21 #9 tabs/ 11 refill, ramipril last filled on 05/13/21 #90 caps with 2 refills ?

## 2022-02-21 LAB — HM MAMMOGRAPHY

## 2022-02-24 ENCOUNTER — Encounter: Payer: Self-pay | Admitting: Family Medicine

## 2022-05-29 ENCOUNTER — Telehealth: Payer: Self-pay | Admitting: Family Medicine

## 2022-05-29 DIAGNOSIS — I10 Essential (primary) hypertension: Secondary | ICD-10-CM

## 2022-05-29 DIAGNOSIS — R7303 Prediabetes: Secondary | ICD-10-CM

## 2022-05-29 NOTE — Telephone Encounter (Signed)
-----   Message from Terri J Walsh sent at 05/19/2022  3:40 PM EDT ----- Regarding: Lab orders for Tuesday, 8.22.23 Patient is scheduled for CPX labs, please order future labs, Thanks , Terri   

## 2022-05-31 ENCOUNTER — Other Ambulatory Visit (INDEPENDENT_AMBULATORY_CARE_PROVIDER_SITE_OTHER): Payer: BC Managed Care – PPO

## 2022-05-31 DIAGNOSIS — R7303 Prediabetes: Secondary | ICD-10-CM

## 2022-05-31 DIAGNOSIS — I10 Essential (primary) hypertension: Secondary | ICD-10-CM | POA: Diagnosis not present

## 2022-05-31 LAB — CBC WITH DIFFERENTIAL/PLATELET
Basophils Absolute: 0.1 10*3/uL (ref 0.0–0.1)
Basophils Relative: 1 % (ref 0.0–3.0)
Eosinophils Absolute: 0.1 10*3/uL (ref 0.0–0.7)
Eosinophils Relative: 1.4 % (ref 0.0–5.0)
HCT: 41.4 % (ref 36.0–46.0)
Hemoglobin: 13.7 g/dL (ref 12.0–15.0)
Lymphocytes Relative: 32 % (ref 12.0–46.0)
Lymphs Abs: 1.7 10*3/uL (ref 0.7–4.0)
MCHC: 33.1 g/dL (ref 30.0–36.0)
MCV: 89.7 fl (ref 78.0–100.0)
Monocytes Absolute: 0.6 10*3/uL (ref 0.1–1.0)
Monocytes Relative: 11.1 % (ref 3.0–12.0)
Neutro Abs: 2.8 10*3/uL (ref 1.4–7.7)
Neutrophils Relative %: 54.5 % (ref 43.0–77.0)
Platelets: 323 10*3/uL (ref 150.0–400.0)
RBC: 4.61 Mil/uL (ref 3.87–5.11)
RDW: 13.5 % (ref 11.5–15.5)
WBC: 5.2 10*3/uL (ref 4.0–10.5)

## 2022-05-31 LAB — COMPREHENSIVE METABOLIC PANEL
ALT: 23 U/L (ref 0–35)
AST: 22 U/L (ref 0–37)
Albumin: 4.6 g/dL (ref 3.5–5.2)
Alkaline Phosphatase: 95 U/L (ref 39–117)
BUN: 15 mg/dL (ref 6–23)
CO2: 27 mEq/L (ref 19–32)
Calcium: 9.5 mg/dL (ref 8.4–10.5)
Chloride: 104 mEq/L (ref 96–112)
Creatinine, Ser: 0.7 mg/dL (ref 0.40–1.20)
GFR: 97.22 mL/min (ref >60.00)
Glucose, Bld: 100 mg/dL — ABNORMAL HIGH (ref 70–99)
Potassium: 4.6 mEq/L (ref 3.5–5.1)
Sodium: 141 mEq/L (ref 135–145)
Total Bilirubin: 0.4 mg/dL (ref 0.2–1.2)
Total Protein: 6.9 g/dL (ref 6.0–8.3)

## 2022-05-31 LAB — LIPID PANEL
Cholesterol: 170 mg/dL (ref 0–200)
HDL: 46 mg/dL (ref >39.00)
LDL Cholesterol: 100 mg/dL — ABNORMAL HIGH (ref 0–99)
NonHDL: 124.12
Total CHOL/HDL Ratio: 4
Triglycerides: 122 mg/dL (ref 0.0–149.0)
VLDL: 24.4 mg/dL (ref 0.0–40.0)

## 2022-05-31 LAB — HEMOGLOBIN A1C: Hgb A1c MFr Bld: 6.1 % (ref 4.6–6.5)

## 2022-05-31 LAB — TSH: TSH: 1.06 u[IU]/mL (ref 0.35–5.50)

## 2022-06-07 ENCOUNTER — Ambulatory Visit (INDEPENDENT_AMBULATORY_CARE_PROVIDER_SITE_OTHER): Payer: BC Managed Care – PPO | Admitting: Family Medicine

## 2022-06-07 ENCOUNTER — Encounter: Payer: Self-pay | Admitting: Family Medicine

## 2022-06-07 VITALS — BP 128/76 | HR 78 | Temp 98.1°F | Ht 63.5 in | Wt 190.0 lb

## 2022-06-07 DIAGNOSIS — Z0000 Encounter for general adult medical examination without abnormal findings: Secondary | ICD-10-CM

## 2022-06-07 DIAGNOSIS — R7303 Prediabetes: Secondary | ICD-10-CM

## 2022-06-07 DIAGNOSIS — I10 Essential (primary) hypertension: Secondary | ICD-10-CM | POA: Diagnosis not present

## 2022-06-07 MED ORDER — RAMIPRIL 5 MG PO CAPS: 5.0000 mg | ORAL_CAPSULE | Freq: Every day | ORAL | 3 refills | Status: DC

## 2022-06-07 NOTE — Assessment & Plan Note (Signed)
bp in fair control at this time  BP Readings from Last 1 Encounters:  06/07/22 128/76   No changes needed Most recent labs reviewed  Disc lifstyle change with low sodium diet and exercise  Plan to continue altace 5 mg daily

## 2022-06-07 NOTE — Progress Notes (Signed)
Subjective:    Patient ID: Amber Baker, female    DOB: 01-Feb-1966, 56 y.o.   MRN: [id number]  HPI Here for health maintenance exam and to review chronic medical problems    Wt Readings from Last 3 Encounters:  06/07/22 190 lb (86.2 kg)  12/20/21 191 lb (86.6 kg)  01/19/21 185 lb 2 oz (84 kg)   33.13 kg/m  Doing ok  Taking care of herself   Just went to the beach -good trip    Immunization History  Administered Date(s) Administered   Hep A / Hep B 10/30/2017   Influenza Split 07/26/2012   Tdap 10/30/2017   Typhoid Live 10/31/2017   Health Maintenance Due  Topic Date Due   COVID-19 Vaccine (1) Never done   Hepatitis C Screening  Never done   Zoster Vaccines- Shingrix (1 of 2) Never done   INFLUENZA VACCINE  05/10/2022   Shingrix: declines   Declines flu shots   Pap 11/2019 nl with neg HPV   No gyn problems  No periods   Mammogram 02/2022  Self breast exam: no lumps   Colonoscopy 08/2018    HTN bp is stable today  No cp or palpitations or headaches or edema  No side effects to medicines  BP Readings from Last 3 Encounters:  06/07/22 128/76  12/20/21 126/86  01/19/21 132/80    Altace 5 mg daily   No symptoms at all   Lab Results  Component Value Date   CREATININE 0.70 05/31/2022   BUN 15 05/31/2022   NA 141 05/31/2022   K 4.6 05/31/2022   CL 104 05/31/2022   CO2 27 05/31/2022    Cholesterol Lab Results  Component Value Date   CHOL 170 05/31/2022   CHOL 169 12/07/2020   CHOL 164 11/11/2019   Lab Results  Component Value Date   HDL 46.00 05/31/2022   HDL 46.00 12/07/2020   HDL 51.70 11/11/2019   Lab Results  Component Value Date   LDLCALC 100 (H) 05/31/2022   LDLCALC 97 11/11/2019   LDLCALC 96 06/18/2018   Lab Results  Component Value Date   TRIG 122.0 05/31/2022   TRIG 203.0 (H) 12/07/2020   TRIG 133 06/11/2020   Lab Results  Component Value Date   CHOLHDL 4 05/31/2022   CHOLHDL 4 12/07/2020   CHOLHDL 3 11/11/2019    Lab Results  Component Value Date   LDLDIRECT 102.0 12/07/2020   Does like fatty foods Diet is not always great  Less fried foods now  Too much red meat   Exercise - walks her dog twice daily  20 minutes each time     Prediabetes Lab Results  Component Value Date   HGBA1C 6.1 05/31/2022   Too much sugar and carbs  Family history of DM    Takes imitrex for migraine prn   Lab Results  Component Value Date   WBC 5.2 05/31/2022   HGB 13.7 05/31/2022   HCT 41.4 05/31/2022   MCV 89.7 05/31/2022   PLT 323.0 05/31/2022   Lab Results  Component Value Date   TSH 1.06 05/31/2022   Glucose 100 Lab Results  Component Value Date   ALT 23 05/31/2022   AST 22 05/31/2022   ALKPHOS 95 05/31/2022   BILITOT 0.4 05/31/2022     Patient Active Problem List   Diagnosis Date Noted   Low back pain 12/20/2021   Prediabetes 07/12/2020   History of COVID-19 07/12/2020   Fatigue 08/29/2016  Fecal incontinence 07/25/2016   Chronic paronychia of finger 07/25/2016   Encounter for routine gynecological examination 05/23/2016   Routine general medical examination at a health care facility 02/14/2013   Allergic rhinitis 02/14/2013   Migraine headache 11/27/2007   Essential hypertension, benign 08/10/2007   Past Medical History:  Diagnosis Date   Abdominal pain, generalized    Abnormal weight gain    Acute upper respiratory infections of unspecified site    Allergy    Arthritis    Feet - diagnosed by Podiatrist   Essential hypertension, benign    Gastroenteritis    Migraine, unspecified, without mention of intractable migraine without mention of status migrainosus    Routine general medical examination at a health care facility    Past Surgical History:  Procedure Laterality Date   CHOLECYSTECTOMY     OVARY SURGERY Left    Social History   Tobacco Use   Smoking status: Never   Smokeless tobacco: Never  Vaping Use   Vaping Use: Never used  Substance Use Topics    Alcohol use: No    Alcohol/week: 0.0 standard drinks of alcohol   Drug use: No   Family History  Problem Relation Age of Onset   Kidney cancer Mother    Colon cancer Neg Hx    Esophageal cancer Neg Hx    Rectal cancer Neg Hx    Stomach cancer Neg Hx    Allergies  Allergen Reactions   Penicillins Other (See Comments)    REACTION: unknown Childhood   Current Outpatient Medications on File Prior to Visit  Medication Sig Dispense Refill   albuterol (VENTOLIN HFA) 108 (90 Base) MCG/ACT inhaler Inhale 2 puffs into the lungs every 4 (four) hours.     Aspirin-Salicylamide-Caffeine (BC HEADACHE POWDER PO) Take 1 application by mouth daily as needed (Headache).      Cholecalciferol (VITAMIN D-3) 125 MCG (5000 UT) TABS Take 5,000 Units by mouth daily.     DHEA 10 MG TABS Take 10 mg by mouth daily.      Digestive Enzymes (SUPER ENZYMES PO) Take 1 tablet by mouth daily.     fexofenadine (ALLEGRA) 180 MG tablet Take 180 mg by mouth daily as needed for allergies.      ibuprofen (ADVIL,MOTRIN) 200 MG tablet Take 200 mg by mouth every 6 (six) hours as needed for headache or moderate pain.      meloxicam (MOBIC) 15 MG tablet Take 1 tablet (15 mg total) by mouth daily. 30 tablet 3   naproxen sodium (ALEVE) 220 MG tablet Take 220 mg by mouth daily as needed (headache).      Probiotic Product (PROBIOTIC DAILY PO) Take 1 tablet by mouth daily.      SUMAtriptan (IMITREX) 50 MG tablet TAKE 1 TABLET BY MOUTH AT ONSET OF HEADACHE. MAY REPEAT IN 2 HOURS IF HEADACHE PERSISTS OR RECURS 9 tablet 11   No current facility-administered medications on file prior to visit.    Review of Systems  Constitutional:  Negative for activity change, appetite change, fatigue, fever and unexpected weight change.  HENT:  Negative for congestion, ear pain, rhinorrhea, sinus pressure and sore throat.   Eyes:  Negative for pain, redness and visual disturbance.  Respiratory:  Negative for cough, shortness of breath and wheezing.    Cardiovascular:  Negative for chest pain and palpitations.  Gastrointestinal:  Negative for abdominal pain, blood in stool, constipation and diarrhea.  Endocrine: Negative for polydipsia and polyuria.  Genitourinary:  Negative for dysuria,  frequency and urgency.  Musculoskeletal:  Negative for arthralgias, back pain and myalgias.  Skin:  Negative for pallor and rash.  Allergic/Immunologic: Negative for environmental allergies.  Neurological:  Negative for dizziness, syncope and headaches.  Hematological:  Negative for adenopathy. Does not bruise/bleed easily.  Psychiatric/Behavioral:  Negative for decreased concentration and dysphoric mood. The patient is not nervous/anxious.        Objective:   Physical Exam Constitutional:      General: She is not in acute distress.    Appearance: Normal appearance. She is well-developed. She is obese. She is not ill-appearing or diaphoretic.  HENT:     Head: Normocephalic and atraumatic.     Right Ear: Tympanic membrane, ear canal and external ear normal.     Left Ear: Tympanic membrane, ear canal and external ear normal.     Nose: Nose normal. No congestion.     Mouth/Throat:     Mouth: Mucous membranes are moist.     Pharynx: Oropharynx is clear. No posterior oropharyngeal erythema.  Eyes:     General: No scleral icterus.    Extraocular Movements: Extraocular movements intact.     Conjunctiva/sclera: Conjunctivae normal.     Pupils: Pupils are equal, round, and reactive to light.  Neck:     Thyroid: No thyromegaly.     Vascular: No carotid bruit or JVD.  Cardiovascular:     Rate and Rhythm: Normal rate and regular rhythm.     Pulses: Normal pulses.     Heart sounds: Normal heart sounds.     No gallop.  Pulmonary:     Effort: Pulmonary effort is normal. No respiratory distress.     Breath sounds: Normal breath sounds. No wheezing.     Comments: Good air exch Chest:     Chest wall: No tenderness.  Abdominal:     General: Bowel  sounds are normal. There is no distension or abdominal bruit.     Palpations: Abdomen is soft. There is no mass.     Tenderness: There is no abdominal tenderness.     Hernia: No hernia is present.  Genitourinary:    Comments: Breast exam: No mass, nodules, thickening, tenderness, bulging, retraction, inflamation, nipple discharge or skin changes noted.  No axillary or clavicular LA.     Musculoskeletal:        General: No tenderness. Normal range of motion.     Cervical back: Normal range of motion and neck supple. No rigidity. No muscular tenderness.     Right lower leg: No edema.     Left lower leg: No edema.     Comments: No kyphosis   Lymphadenopathy:     Cervical: No cervical adenopathy.  Skin:    General: Skin is warm and dry.     Coloration: Skin is not pale.     Findings: No erythema or rash.     Comments: Solar lentigines diffusely   Neurological:     Mental Status: She is alert. Mental status is at baseline.     Cranial Nerves: No cranial nerve deficit.     Motor: No abnormal muscle tone.     Coordination: Coordination normal.     Gait: Gait normal.     Deep Tendon Reflexes: Reflexes are normal and symmetric. Reflexes normal.  Psychiatric:        Mood and Affect: Mood normal.        Cognition and Memory: Cognition and memory normal.  Assessment & Plan:   Problem List Items Addressed This Visit       Cardiovascular and Mediastinum   Essential hypertension, benign    bp in fair control at this time  BP Readings from Last 1 Encounters:  06/07/22 128/76  No changes needed Most recent labs reviewed  Disc lifstyle change with low sodium diet and exercise  Plan to continue altace 5 mg daily       Relevant Medications   ramipril (ALTACE) 5 MG capsule     Other   Prediabetes    Lab Results  Component Value Date   HGBA1C 6.1 05/31/2022  disc imp of low glycemic diet and wt loss to prevent DM2       Routine general medical examination at a  health care facility - Primary    Reviewed health habits including diet and exercise and skin cancer prevention Reviewed appropriate screening tests for age  Also reviewed health mt list, fam hx and immunization status , as well as social and family history   See HPI Labs reviewed  Declines shingrix and flu vaccines  Pap utd  Mammogram utd from 02/2022 Colonoscopy utd 08/2018  Discussed lowering trans fat in diet with less red meat if possible       Other Visit Diagnoses     Essential hypertension       Relevant Medications   ramipril (ALTACE) 5 MG capsule

## 2022-06-07 NOTE — Assessment & Plan Note (Signed)
Reviewed health habits including diet and exercise and skin cancer prevention Reviewed appropriate screening tests for age  Also reviewed health mt list, fam hx and immunization status , as well as social and family history   See HPI Labs reviewed  Declines shingrix and flu vaccines  Pap utd  Mammogram utd from 02/2022 Colonoscopy utd 08/2018  Discussed lowering trans fat in diet with less red meat if possible

## 2022-06-07 NOTE — Assessment & Plan Note (Signed)
Lab Results  Component Value Date   HGBA1C 6.1 05/31/2022   disc imp of low glycemic diet and wt loss to prevent DM2

## 2022-06-07 NOTE — Patient Instructions (Addendum)
For cholesterol Avoid red meat/ fried foods/ egg yolks/ fatty breakfast meats/ butter, cheese and high fat dairy/ and shellfish   If you cook with ground beef try ground [place]   Try to get most of your carbohydrates from produce (with the exception of white potatoes)  Eat less bread/pasta/rice/snack foods/cereals/sweets and other items from the middle of the grocery store (processed carbs)   Keep walking   Take care of yourself   Use sun protection

## 2022-10-20 ENCOUNTER — Ambulatory Visit: Payer: BC Managed Care – PPO | Admitting: Internal Medicine

## 2022-10-20 ENCOUNTER — Encounter: Payer: Self-pay | Admitting: Internal Medicine

## 2022-10-20 VITALS — BP 138/88 | HR 88 | Temp 97.6°F | Ht 63.5 in | Wt 188.0 lb

## 2022-10-20 DIAGNOSIS — I10 Essential (primary) hypertension: Secondary | ICD-10-CM | POA: Diagnosis not present

## 2022-10-20 NOTE — Progress Notes (Signed)
Subjective:    Patient ID: Amber Baker, female    DOB: 12-25-1965, 57 y.o.   MRN: [id number]  HPI Here due to concerns about elevated blood pressure  Has been on ramipril about 20 years Checks BP rarely but felt restless last night Could feel her heartbeat in chest and in her ears Was hard but not fast BP got up to 179/86---- 174/99. Then down to 142/86 a few hours later Did have slight upper chest burning--no more than 1 minute Left arm felt funny  Has taken on baking cookies for a wedding Stressed out about this  Current Outpatient Medications on File Prior to Visit  Medication Sig Dispense Refill   albuterol (VENTOLIN HFA) 108 (90 Base) MCG/ACT inhaler Inhale 2 puffs into the lungs every 4 (four) hours.     Aspirin-Salicylamide-Caffeine (BC HEADACHE POWDER PO) Take 1 application by mouth daily as needed (Headache).      Cholecalciferol (VITAMIN D-3) 125 MCG (5000 UT) TABS Take 5,000 Units by mouth daily.     DHEA 10 MG TABS Take 10 mg by mouth daily.      Digestive Enzymes (SUPER ENZYMES PO) Take 1 tablet by mouth daily.     fexofenadine (ALLEGRA) 180 MG tablet Take 180 mg by mouth daily as needed for allergies.      ibuprofen (ADVIL,MOTRIN) 200 MG tablet Take 200 mg by mouth every 6 (six) hours as needed for headache or moderate pain.      naproxen sodium (ALEVE) 220 MG tablet Take 220 mg by mouth daily as needed (headache).      Probiotic Product (PROBIOTIC DAILY PO) Take 1 tablet by mouth daily.      ramipril (ALTACE) 5 MG capsule Take 1 capsule (5 mg total) by mouth daily. 90 capsule 3   SUMAtriptan (IMITREX) 50 MG tablet TAKE 1 TABLET BY MOUTH AT ONSET OF HEADACHE. MAY REPEAT IN 2 HOURS IF HEADACHE PERSISTS OR RECURS 9 tablet 11   No current facility-administered medications on file prior to visit.    Allergies  Allergen Reactions   Penicillins Other (See Comments)    REACTION: unknown Childhood    Past Medical History:  Diagnosis Date   Abdominal pain,  generalized    Abnormal weight gain    Acute upper respiratory infections of unspecified site    Allergy    Arthritis    Feet - diagnosed by Podiatrist   Essential hypertension, benign    Gastroenteritis    Migraine, unspecified, without mention of intractable migraine without mention of status migrainosus    Routine general medical examination at a health care facility     Past Surgical History:  Procedure Laterality Date   CHOLECYSTECTOMY     OVARY SURGERY Left     Family History  Problem Relation Age of Onset   Kidney cancer Mother    Colon cancer Neg Hx    Esophageal cancer Neg Hx    Rectal cancer Neg Hx    Stomach cancer Neg Hx     Social History   Socioeconomic History   Marital status: Married    Spouse name: Not on file   Number of children: 1   Years of education: Not on file   Highest education level: Not on file  Occupational History   Occupation: hair stylist  Tobacco Use   Smoking status: Never   Smokeless tobacco: Never  Vaping Use   Vaping Use: Never used  Substance and Sexual Activity   Alcohol use:  No    Alcohol/week: 0.0 standard drinks of alcohol   Drug use: No   Sexual activity: Not on file  Other Topics Concern   Not on file  Social History Narrative   Husband diagnosed with bladder cancer 07/2007.   Social Determinants of Health   Financial Resource Strain: Not on file  Food Insecurity: Not on file  Transportation Needs: Not on file  Physical Activity: Not on file  Stress: Not on file  Social Connections: Not on file  Intimate Partner Violence: Not on file   Review of Systems Walks dog twice a day---leisurely. No symptoms then Sleeps okay usually---not last night No dizziness or syncope     Objective:   Physical Exam Constitutional:      Appearance: Normal appearance.  Cardiovascular:     Rate and Rhythm: Normal rate and regular rhythm.     Heart sounds: No murmur heard.    No gallop.  Pulmonary:     Effort: Pulmonary  effort is normal.     Breath sounds: Normal breath sounds. No wheezing or rales.  Musculoskeletal:     Cervical back: Neck supple.     Right lower leg: No edema.     Left lower leg: No edema.  Lymphadenopathy:     Cervical: No cervical adenopathy.  Neurological:     Mental Status: She is alert.            Assessment & Plan:

## 2022-10-20 NOTE — Assessment & Plan Note (Signed)
BP Readings from Last 3 Encounters:  10/20/22 138/88  06/07/22 128/76  12/20/21 126/86   Repeat 136/92 on right Reassuring exam Sounds like the elevation yesterday was stress related Nothing to suggest SVT She will monitor her BP periodically ---and it consistently over 150/90 at rest, will consider increasing the ramipril to 10mg  daily

## 2023-02-27 LAB — HM MAMMOGRAPHY

## 2023-03-14 ENCOUNTER — Other Ambulatory Visit: Payer: Self-pay | Admitting: Family Medicine

## 2023-03-14 DIAGNOSIS — G43709 Chronic migraine without aura, not intractable, without status migrainosus: Secondary | ICD-10-CM

## 2023-05-10 ENCOUNTER — Encounter (INDEPENDENT_AMBULATORY_CARE_PROVIDER_SITE_OTHER): Payer: Self-pay

## 2023-05-10 IMAGING — DX DG LUMBAR SPINE COMPLETE 4+V
5 series · 5 of 5 positions shown · non-contrast
Comparison: None.

CLINICAL DATA: Right hip pain radiating into right anterior lower
leg.

EXAM:
LUMBAR SPINE - COMPLETE 4+ VIEW

[lumbar spine ap]
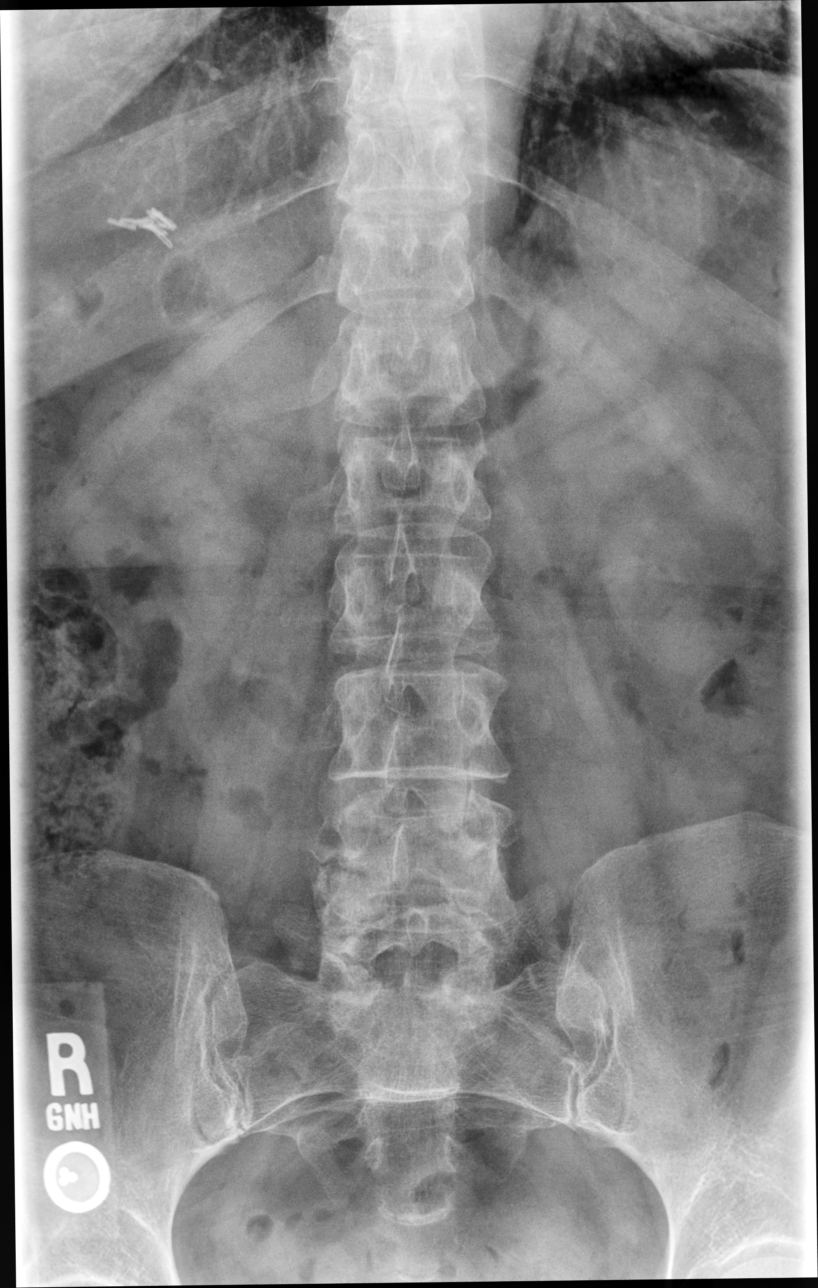

[lumbar spine mlo (1 of 2)]
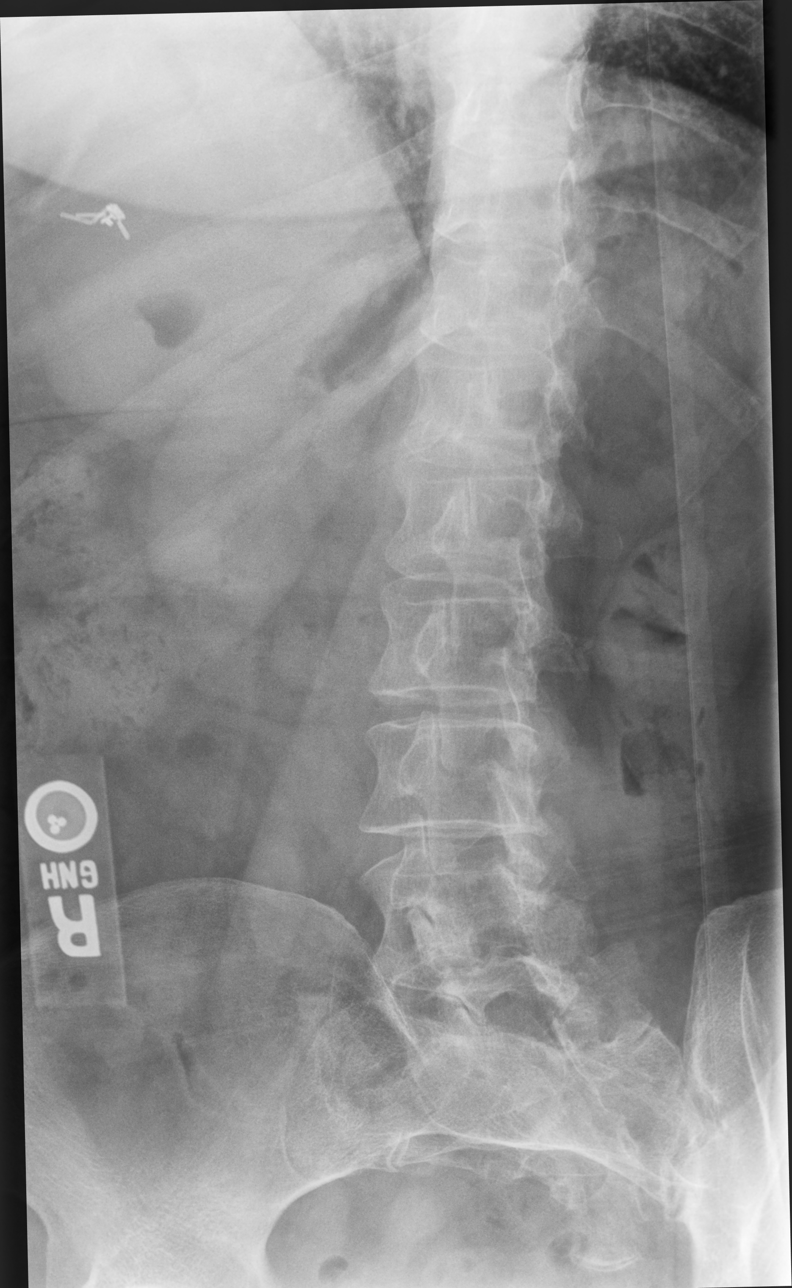

[lumbar spine mlo (2 of 2)]
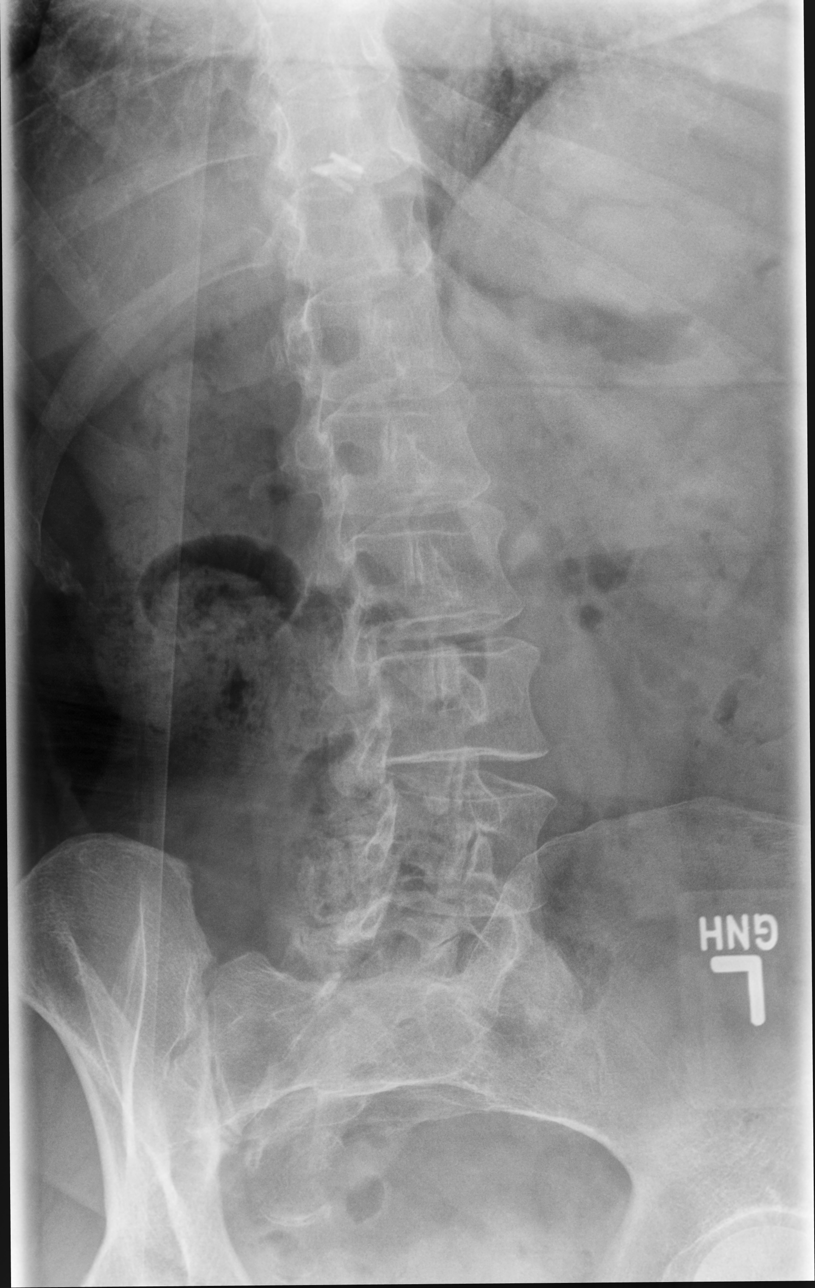

[lumbar spine lat (1 of 2)]
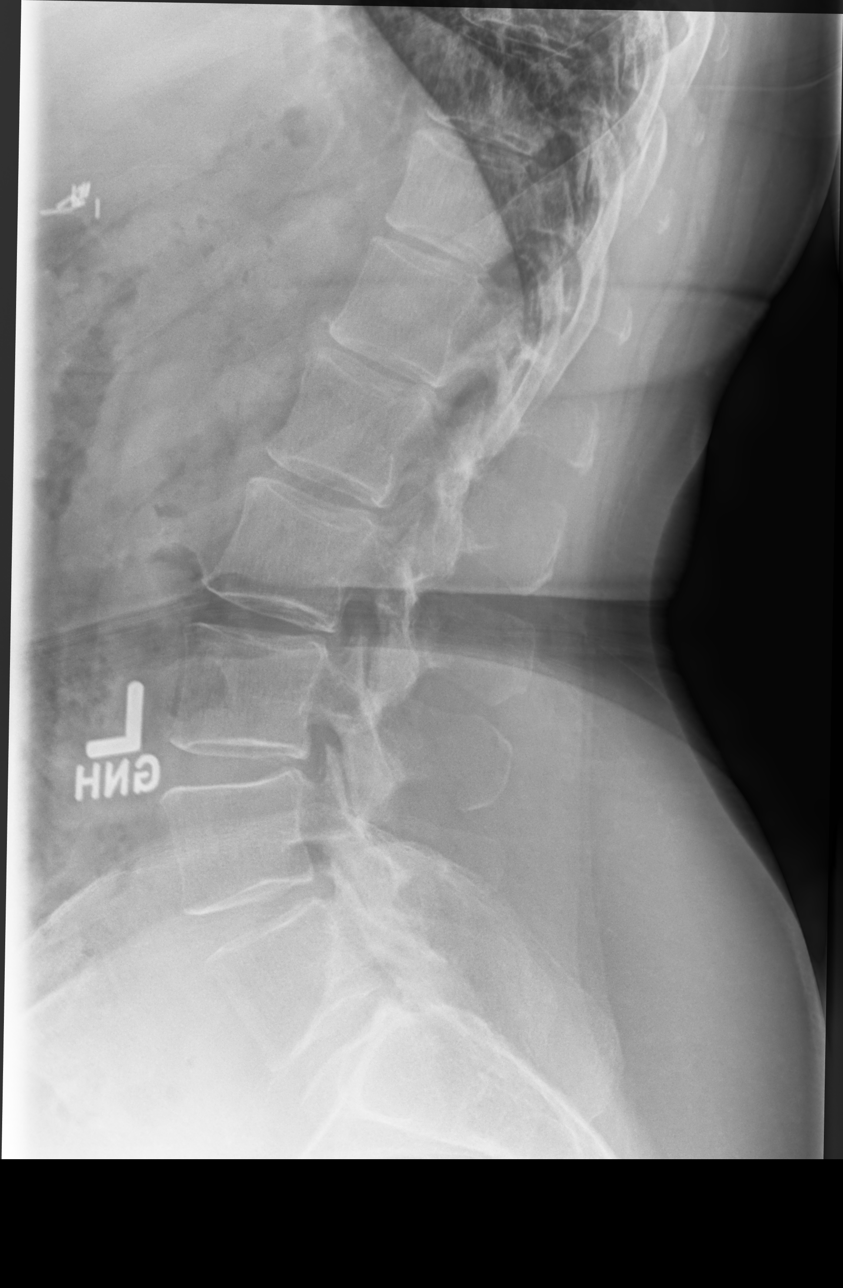

[lumbar spine lat (2 of 2)]
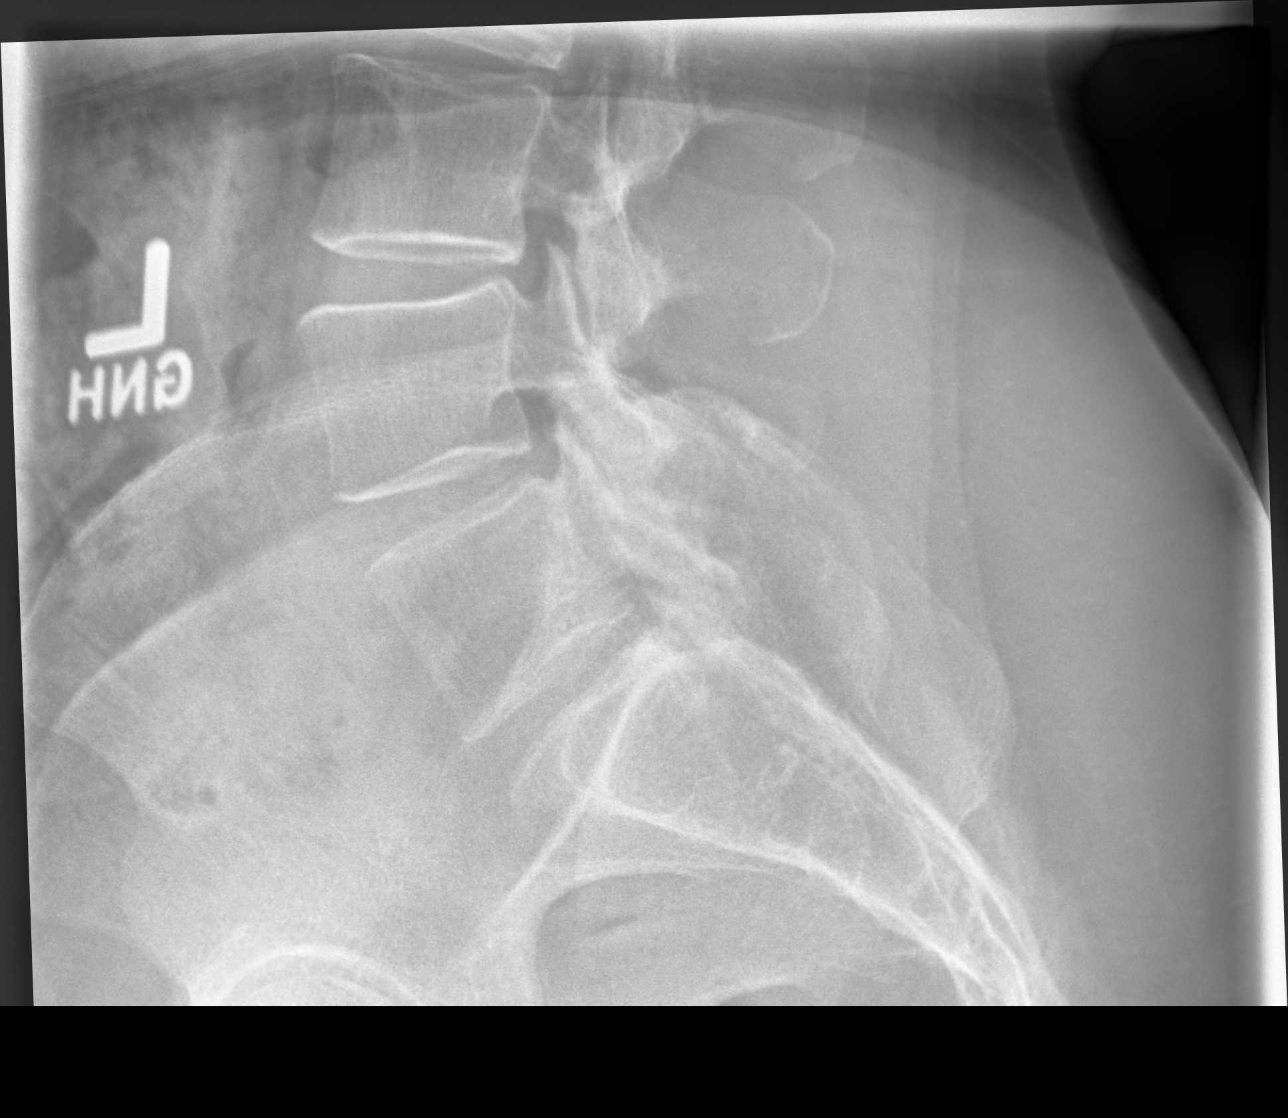

[5 of 5 positions shown; findings below may reference images not displayed]

FINDINGS: Five non-rib-bearing lumbar vertebral bodies.

\Mild facet arthropathy at the L5-S1 level. There is no evidence
of lumbar spine fracture. Alignment is normal. Mild intervertebral
disc space narrowing at the L5-S1 level.

Right upper quadrant surgical clips.
IMPRESSION: No acute displaced fracture or traumatic listhesis of the lumbar
spine.

## 2023-05-29 ENCOUNTER — Ambulatory Visit: Payer: BC Managed Care – PPO | Admitting: Family Medicine

## 2023-05-29 VITALS — BP 129/80 | HR 63 | Temp 97.8°F | Ht 63.5 in | Wt 180.4 lb

## 2023-05-29 DIAGNOSIS — I10 Essential (primary) hypertension: Secondary | ICD-10-CM | POA: Diagnosis not present

## 2023-05-29 DIAGNOSIS — Z8379 Family history of other diseases of the digestive system: Secondary | ICD-10-CM

## 2023-05-29 DIAGNOSIS — Z1159 Encounter for screening for other viral diseases: Secondary | ICD-10-CM | POA: Insufficient documentation

## 2023-05-29 DIAGNOSIS — F4320 Adjustment disorder, unspecified: Secondary | ICD-10-CM

## 2023-05-29 DIAGNOSIS — G43709 Chronic migraine without aura, not intractable, without status migrainosus: Secondary | ICD-10-CM | POA: Diagnosis not present

## 2023-05-29 DIAGNOSIS — F4321 Adjustment disorder with depressed mood: Secondary | ICD-10-CM

## 2023-05-29 DIAGNOSIS — R7989 Other specified abnormal findings of blood chemistry: Secondary | ICD-10-CM

## 2023-05-29 MED ORDER — RAMIPRIL 5 MG PO CAPS: 5.0000 mg | ORAL_CAPSULE | Freq: Every day | ORAL | 2 refills | Status: DC

## 2023-05-29 MED ORDER — SUMATRIPTAN SUCCINATE 50 MG PO TABS
ORAL_TABLET | ORAL | 5 refills | Status: DC
Start: 2023-05-29 — End: 2024-03-18

## 2023-05-29 NOTE — Patient Instructions (Addendum)
Try and find out what caused your father's cirrhosis  Let us know if you discover something   Schedule labs in about a month  (we are waiting because you just had covid and had medications)   Continue grief counseling if you can

## 2023-05-29 NOTE — Assessment & Plan Note (Signed)
Refilled sumatriptan for headache prn No headache today

## 2023-05-29 NOTE — Assessment & Plan Note (Addendum)
Father died of cirrhosis Unsure what cause was  She may try and fid this out  She herself has had normal liver tests and no symptoms  As a rule avoids acetaminophen and alcohol  Did have elevated ferritin once in setting of hosp for covid 2021 Just had covid recently   Will hold off on lab work for 1 month to get farther away from virus  She will investigate her father's case in the meantime   Encouraged diet /exercise /weight loss to prevent NASH

## 2023-05-29 NOTE — Assessment & Plan Note (Signed)
Pt had elevated ferritin in setting of severe covid 2021 in hospital  Will plan to re check this (father had cirrhosis) along with liver labs  Since she recently had covid again -will wait a month to check  No history of elevated Hb

## 2023-05-29 NOTE — Assessment & Plan Note (Signed)
bp in fair control at this time  BP Readings from Last 1 Encounters:  05/29/23 129/80   No changes needed Most recent labs reviewed  Disc lifstyle change with low sodium diet and exercise  Plan to continue altace 5 mg daily

## 2023-05-29 NOTE — Progress Notes (Signed)
Subjective:    Patient ID: Amber Baker, female    DOB: 1965/12/03, 57 y.o.   MRN: [id number]  HPI  Wt Readings from Last 3 Encounters:  05/29/23 180 lb 6 oz (81.8 kg)  10/20/22 188 lb (85.3 kg)  06/07/22 190 lb (86.2 kg)   31.45 kg/m  Vitals:   05/29/23 0926 05/29/23 0956  BP: (!) 142/82 129/80  Pulse: 63   Temp: 97.8 F (36.6 C)   SpO2: 96%     Pt presents to discuss some family /genetic health issues  Father died earlier this year of cirrhosis / died of it   (he knew about it)  Was 17 when he died  Was not alcoholic , he drank in more distant past but not close to time of death  Did not eat well  Was overweight   Had paternal [place] aunt with cirrhosis   Mother may have dementia= has a hard time getting her to see her doctor   Needs refills of altace and imitrex    Tylenol- just had covid and took some but usually does not take    Alcohol : none   Is feeling fine   Took prednisone and zpack and ivermectin for covid earlier this month   She has 3 younger brothers      Lab Results  Component Value Date   ALT 23 05/31/2022   AST 22 05/31/2022   ALKPHOS 95 05/31/2022   BILITOT 0.4 05/31/2022    Lab Results  Component Value Date   FERRITIN 549 (H) 06/16/2020   Had severe covid/in hospital in 2021 when this was tested    Lab Results  Component Value Date   WBC 5.2 05/31/2022   HGB 13.7 05/31/2022   HCT 41.4 05/31/2022   MCV 89.7 05/31/2022   PLT 323.0 05/31/2022    HTN bp is stable today  No cp or palpitations or headaches or edema  No side effects to medicines  BP Readings from Last 3 Encounters:  05/29/23 129/80  10/20/22 138/88  06/07/22 128/76     Lab Results  Component Value Date   CHOL 170 05/31/2022   HDL 46.00 05/31/2022   LDLCALC 100 (H) 05/31/2022   LDLDIRECT 102.0 12/07/2020   TRIG 122.0 05/31/2022   CHOLHDL 4 05/31/2022     Patient Active Problem List   Diagnosis Date Noted   Grief reaction 05/29/2023    Family history of cirrhosis of liver 05/29/2023   Elevated ferritin 05/29/2023   Encounter for hepatitis C screening test for low risk patient 05/29/2023   Low back pain 12/20/2021   Prediabetes 07/12/2020   History of COVID-19 07/12/2020   Fatigue 08/29/2016   Fecal incontinence 07/25/2016   Chronic paronychia of finger 07/25/2016   Encounter for routine gynecological examination 05/23/2016   Routine general medical examination at a health care facility 02/14/2013   Allergic rhinitis 02/14/2013   Migraine headache 11/27/2007   Essential hypertension, benign 08/10/2007   Past Medical History:  Diagnosis Date   Abdominal pain, generalized    Abnormal weight gain    Acute upper respiratory infections of unspecified site    Allergy    Arthritis    Feet - diagnosed by Podiatrist   Essential hypertension, benign    Gastroenteritis    Migraine, unspecified, without mention of intractable migraine without mention of status migrainosus    Routine general medical examination at a health care facility    Past Surgical History:  Procedure Laterality Date  CHOLECYSTECTOMY     OVARY SURGERY Left    Social History   Tobacco Use   Smoking status: Never   Smokeless tobacco: Never  Vaping Use   Vaping status: Never Used  Substance Use Topics   Alcohol use: No    Alcohol/week: 0.0 standard drinks of alcohol   Drug use: No   Family History  Problem Relation Age of Onset   Kidney cancer Mother    Colon cancer Neg Hx    Esophageal cancer Neg Hx    Rectal cancer Neg Hx    Stomach cancer Neg Hx    Allergies  Allergen Reactions   Penicillins Other (See Comments)    REACTION: unknown Childhood   Current Outpatient Medications on File Prior to Visit  Medication Sig Dispense Refill   albuterol (VENTOLIN HFA) 108 (90 Base) MCG/ACT inhaler Inhale 2 puffs into the lungs every 4 (four) hours.     Aspirin-Salicylamide-Caffeine (BC HEADACHE POWDER PO) Take 1 application by mouth daily  as needed (Headache).      Cholecalciferol (VITAMIN D-3) 125 MCG (5000 UT) TABS Take 5,000 Units by mouth daily.     DHEA 10 MG TABS Take 10 mg by mouth daily.      Digestive Enzymes (SUPER ENZYMES PO) Take 1 tablet by mouth daily.     fexofenadine (ALLEGRA) 180 MG tablet Take 180 mg by mouth daily as needed for allergies.      ibuprofen (ADVIL,MOTRIN) 200 MG tablet Take 200 mg by mouth every 6 (six) hours as needed for headache or moderate pain.      naproxen sodium (ALEVE) 220 MG tablet Take 220 mg by mouth daily as needed (headache).      Probiotic Product (PROBIOTIC DAILY PO) Take 1 tablet by mouth daily.      No current facility-administered medications on file prior to visit.    Review of Systems  Constitutional:  Negative for activity change, appetite change, fatigue, fever and unexpected weight change.  HENT:  Negative for congestion, ear pain, rhinorrhea, sinus pressure and sore throat.   Eyes:  Negative for pain, redness and visual disturbance.  Respiratory:  Negative for cough, shortness of breath and wheezing.   Cardiovascular:  Negative for chest pain and palpitations.  Gastrointestinal:  Negative for abdominal pain, blood in stool, constipation and diarrhea.  Endocrine: Negative for polydipsia and polyuria.  Genitourinary:  Negative for dysuria, frequency and urgency.  Musculoskeletal:  Negative for arthralgias, back pain and myalgias.  Skin:  Negative for pallor and rash.  Allergic/Immunologic: Negative for environmental allergies.  Neurological:  Negative for dizziness, syncope and headaches.  Hematological:  Negative for adenopathy. Does not bruise/bleed easily.  Psychiatric/Behavioral:  Negative for decreased concentration and dysphoric mood. The patient is not nervous/anxious.        Grief Occational tearful        Objective:   Physical Exam Constitutional:      General: She is not in acute distress.    Appearance: Normal appearance. She is well-developed. She  is obese. She is not ill-appearing or diaphoretic.  HENT:     Head: Normocephalic and atraumatic.  Eyes:     Conjunctiva/sclera: Conjunctivae normal.     Pupils: Pupils are equal, round, and reactive to light.  Neck:     Thyroid: No thyromegaly.     Vascular: No carotid bruit or JVD.  Cardiovascular:     Rate and Rhythm: Normal rate and regular rhythm.     Heart sounds: Normal heart  sounds.     No gallop.  Pulmonary:     Effort: Pulmonary effort is normal. No respiratory distress.     Breath sounds: Normal breath sounds. No wheezing or rales.  Abdominal:     General: Abdomen is protuberant. Bowel sounds are normal. There is no distension or abdominal bruit.     Palpations: Abdomen is soft. There is no hepatomegaly, splenomegaly or mass.     Tenderness: There is no abdominal tenderness.     Hernia: No hernia is present.  Musculoskeletal:     Cervical back: Normal range of motion and neck supple.     Right lower leg: No edema.     Left lower leg: No edema.  Lymphadenopathy:     Cervical: No cervical adenopathy.  Skin:    General: Skin is warm and dry.     Coloration: Skin is not pale.     Findings: No rash.     Comments: No skin changes   Neurological:     Mental Status: She is alert.     Coordination: Coordination normal.     Deep Tendon Reflexes: Reflexes are normal and symmetric. Reflexes normal.  Psychiatric:        Mood and Affect: Mood normal.           Assessment & Plan:   Problem List Items Addressed This Visit       Cardiovascular and Mediastinum   Migraine headache    Refilled sumatriptan for headache prn No headache today      Relevant Medications   ramipril (ALTACE) 5 MG capsule   SUMAtriptan (IMITREX) 50 MG tablet   Essential hypertension, benign    bp in fair control at this time  BP Readings from Last 1 Encounters:  05/29/23 129/80   No changes needed Most recent labs reviewed  Disc lifstyle change with low sodium diet and exercise   Plan to continue altace 5 mg daily       Relevant Medications   ramipril (ALTACE) 5 MG capsule     Other   Grief reaction    Doing ok overall after loss of father  Doing counseling with hospice       Family history of cirrhosis of liver - Primary    Father died of cirrhosis Unsure what cause was  She may try and fid this out  She herself has had normal liver tests and no symptoms  As a rule avoids acetaminophen and alcohol  Did have elevated ferritin once in setting of hosp for covid 2021 Just had covid recently   Will hold off on lab work for 1 month to get farther away from virus  She will investigate her father's case in the meantime   Encouraged diet /exercise /weight loss to prevent NASH      Relevant Orders   Ferritin   Iron   Hepatic function panel   Hepatitis C Antibody   Encounter for hepatitis C screening test for low risk patient    Will add hep C to next labs Father died of cirrhosis       Relevant Orders   Hepatitis C Antibody   Elevated ferritin    Pt had elevated ferritin in setting of severe covid 2021 in hospital  Will plan to re check this (father had cirrhosis) along with liver labs  Since she recently had covid again -will wait a month to check  No history of elevated Hb        Relevant  Orders   Ferritin   Iron   CBC with Differential/Platelet   Other Visit Diagnoses     Essential hypertension       Relevant Medications   ramipril (ALTACE) 5 MG capsule

## 2023-05-29 NOTE — Assessment & Plan Note (Signed)
Doing ok overall after loss of father  Doing counseling with hospice

## 2023-05-29 NOTE — Assessment & Plan Note (Signed)
Will add hep C to next labs Father died of cirrhosis

## 2023-05-30 ENCOUNTER — Encounter: Payer: Self-pay | Admitting: Family Medicine

## 2023-05-30 DIAGNOSIS — Z8379 Family history of other diseases of the digestive system: Secondary | ICD-10-CM | POA: Insufficient documentation

## 2023-06-01 ENCOUNTER — Encounter: Payer: Self-pay | Admitting: *Deleted

## 2023-06-26 ENCOUNTER — Encounter: Payer: Self-pay | Admitting: Family Medicine

## 2023-06-26 ENCOUNTER — Ambulatory Visit
Admission: RE | Admit: 2023-06-26 | Discharge: 2023-06-26 | Disposition: A | Discharge status: Home / Self Care | Payer: BC Managed Care – PPO | Source: Ambulatory Visit | Attending: Family Medicine | Admitting: Family Medicine

## 2023-06-26 DIAGNOSIS — K760 Fatty (change of) liver, not elsewhere classified: Secondary | ICD-10-CM | POA: Insufficient documentation

## 2023-06-26 DIAGNOSIS — Z8379 Family history of other diseases of the digestive system: Secondary | ICD-10-CM | POA: Diagnosis present

## 2023-06-26 DIAGNOSIS — Z9049 Acquired absence of other specified parts of digestive tract: Secondary | ICD-10-CM | POA: Insufficient documentation

## 2023-06-26 DIAGNOSIS — R932 Abnormal findings on diagnostic imaging of liver and biliary tract: Secondary | ICD-10-CM | POA: Insufficient documentation

## 2023-07-03 ENCOUNTER — Other Ambulatory Visit (INDEPENDENT_AMBULATORY_CARE_PROVIDER_SITE_OTHER): Payer: BC Managed Care – PPO

## 2023-07-03 DIAGNOSIS — R7989 Other specified abnormal findings of blood chemistry: Secondary | ICD-10-CM | POA: Diagnosis not present

## 2023-07-03 DIAGNOSIS — Z1159 Encounter for screening for other viral diseases: Secondary | ICD-10-CM

## 2023-07-03 DIAGNOSIS — Z8379 Family history of other diseases of the digestive system: Secondary | ICD-10-CM

## 2023-07-03 LAB — CBC WITH DIFFERENTIAL/PLATELET
Basophils Absolute: 0 10*3/uL (ref 0.0–0.1)
Basophils Relative: 0.5 % (ref 0.0–3.0)
Eosinophils Absolute: 0.1 10*3/uL (ref 0.0–0.7)
Eosinophils Relative: 1.7 % (ref 0.0–5.0)
HCT: 43.4 % (ref 36.0–46.0)
Hemoglobin: 14.2 g/dL (ref 12.0–15.0)
Lymphocytes Relative: 23.8 % (ref 12.0–46.0)
Lymphs Abs: 1.6 10*3/uL (ref 0.7–4.0)
MCHC: 32.7 g/dL (ref 30.0–36.0)
MCV: 89.2 fl (ref 78.0–100.0)
Monocytes Absolute: 0.7 10*3/uL (ref 0.1–1.0)
Monocytes Relative: 10 % (ref 3.0–12.0)
Neutro Abs: 4.3 10*3/uL (ref 1.4–7.7)
Neutrophils Relative %: 64 % (ref 43.0–77.0)
Platelets: 346 10*3/uL (ref 150.0–400.0)
RBC: 4.87 Mil/uL (ref 3.87–5.11)
RDW: 13.4 % (ref 11.5–15.5)
WBC: 6.7 10*3/uL (ref 4.0–10.5)

## 2023-07-03 LAB — HEPATIC FUNCTION PANEL
ALT: 19 U/L (ref 0–35)
AST: 20 U/L (ref 0–37)
Albumin: 4.4 g/dL (ref 3.5–5.2)
Alkaline Phosphatase: 82 U/L (ref 39–117)
Bilirubin, Direct: 0.1 mg/dL (ref 0.0–0.3)
Total Bilirubin: 0.5 mg/dL (ref 0.2–1.2)
Total Protein: 7.1 g/dL (ref 6.0–8.3)

## 2023-07-03 LAB — FERRITIN: Ferritin: 75 ng/mL (ref 10.0–291.0)

## 2023-07-03 LAB — IRON: Iron: 107 ug/dL (ref 42–145)

## 2023-07-04 LAB — HEPATITIS C ANTIBODY: Hepatitis C Ab: NONREACTIVE

## 2023-07-06 ENCOUNTER — Telehealth: Payer: Self-pay | Admitting: Family Medicine

## 2023-07-06 NOTE — Telephone Encounter (Signed)
Patient returned call regarding lab results.I read off Dr Milinda Antis lab notes to her,she verbalized understanding.She said that she is not interested in seeing a GI specialist at this time for the fatty liver.

## 2023-07-06 NOTE — Telephone Encounter (Signed)
Aware, that is fine  Her lfts are normal  Will continue to monitor

## 2024-02-22 ENCOUNTER — Ambulatory Visit (INDEPENDENT_AMBULATORY_CARE_PROVIDER_SITE_OTHER): Admitting: Family Medicine

## 2024-02-22 VITALS — BP 138/86 | HR 84 | Temp 97.9°F | Ht 63.5 in | Wt 186.4 lb

## 2024-02-22 DIAGNOSIS — M26622 Arthralgia of left temporomandibular joint: Secondary | ICD-10-CM | POA: Diagnosis not present

## 2024-02-22 DIAGNOSIS — M26629 Arthralgia of temporomandibular joint, unspecified side: Secondary | ICD-10-CM | POA: Insufficient documentation

## 2024-02-22 NOTE — Assessment & Plan Note (Signed)
 Reassuring exam TM joint tenderness and pain with movement but no signs and symptoms of dislocation No temporal pain  Minimal swelling  Recommend warm compresses Ibuprofen with food up to TID for less than a week  Avoid chewing or opening mouth wide  See AVS and handout on TMJ  Update if not starting to improve in a week or if worsening  Call back and Er precautions noted in detail today

## 2024-02-22 NOTE — Progress Notes (Signed)
 Subjective:    Patient ID: Amber Baker, female    DOB: 12-15-1965, 58 y.o.   MRN: [id number]  HPI  Wt Readings from Last 3 Encounters:  02/22/24 186 lb 6 oz (84.5 kg)  05/29/23 180 lb 6 oz (81.8 kg)  10/20/22 188 lb (85.3 kg)   32.50 kg/m  Vitals:   02/22/24 1036  BP: 138/86  Pulse: 84  Temp: 97.9 F (36.6 C)  SpO2: 96%   Pt presents  with left sided jaw pain  Started Monday (3 d ago)  It locked up once 20 y ago -none since  No clicking    Uri symptoms 2 weeks ago are better  Wonders if she has an ear infection   Also opened mouth wide when feeding her grand child   Took ibuprofen (3 of 250 mg)  three times yesterday It helped   Pain is worse after eating  Was tender to the touch and was swollen under her ear  Some pain under ear/not deep in ear No fever   No known dental issues (in general teeth are terrible)   Some headaches-not uncommon for her       Lab Results  Component Value Date   ALT 19 07/03/2023   AST 20 07/03/2023   ALKPHOS 82 07/03/2023   BILITOT 0.5 07/03/2023   Lab Results  Component Value Date   NA 141 05/31/2022   K 4.6 05/31/2022   CO2 27 05/31/2022   GLUCOSE 100 (H) 05/31/2022   BUN 15 05/31/2022   CREATININE 0.70 05/31/2022   CALCIUM 9.5 05/31/2022   GFR 97.22 05/31/2022   GFRNONAA >60 06/16/2020         Patient Active Problem List   Diagnosis Date Noted   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   Fatty liver 06/26/2023   Family history of fatty liver 05/30/2023   Grief reaction 05/29/2023   Family history of cirrhosis of liver 05/29/2023   Elevated ferritin 05/29/2023   Encounter for hepatitis C screening test for low risk patient 05/29/2023   Low back pain 12/20/2021   Prediabetes 07/12/2020   History of COVID-19 07/12/2020   Fatigue 08/29/2016   Fecal incontinence 07/25/2016   Chronic paronychia of finger 07/25/2016   Encounter for routine gynecological examination 05/23/2016   Routine general medical examination  at a health care facility 02/14/2013   Allergic rhinitis 02/14/2013   Migraine headache 11/27/2007   Essential hypertension, benign 08/10/2007   Past Medical History:  Diagnosis Date   Abdominal pain, generalized    Abnormal weight gain    Acute upper respiratory infections of unspecified site    Allergy    Arthritis    Feet - diagnosed by Podiatrist   Essential hypertension, benign    Gastroenteritis    Migraine, unspecified, without mention of intractable migraine without mention of status migrainosus    Routine general medical examination at a health care facility    Past Surgical History:  Procedure Laterality Date   CHOLECYSTECTOMY     OVARY SURGERY Left    Social History   Tobacco Use   Smoking status: Never   Smokeless tobacco: Never  Vaping Use   Vaping status: Never Used  Substance Use Topics   Alcohol use: No    Alcohol/week: 0.0 standard drinks of alcohol   Drug use: No   Family History  Problem Relation Age of Onset   Kidney cancer Mother    Colon cancer Neg Hx    Esophageal cancer Neg Hx  Rectal cancer Neg Hx    Stomach cancer Neg Hx    Allergies  Allergen Reactions   Penicillins Other (See Comments)    REACTION: unknown Childhood   Current Outpatient Medications on File Prior to Visit  Medication Sig Dispense Refill   albuterol  (VENTOLIN  HFA) 108 (90 Base) MCG/ACT inhaler Inhale 2 puffs into the lungs every 4 (four) hours.     Ascorbic Acid (VITAMIN C ) 1000 MG tablet Take 1,000 mg by mouth daily.     Aspirin-Salicylamide-Caffeine (BC HEADACHE POWDER PO) Take 1 application by mouth daily as needed (Headache).      Cholecalciferol (VITAMIN D -3) 125 MCG (5000 UT) TABS Take 5,000 Units by mouth daily.     DHEA 10 MG TABS Take 10 mg by mouth daily.      Digestive Enzymes (SUPER ENZYMES PO) Take 1 tablet by mouth daily.     fexofenadine (ALLEGRA) 180 MG tablet Take 180 mg by mouth daily as needed for allergies.      ibuprofen (ADVIL,MOTRIN) 200 MG  tablet Take 200 mg by mouth every 6 (six) hours as needed for headache or moderate pain.      naproxen sodium (ALEVE) 220 MG tablet Take 220 mg by mouth daily as needed (headache).      Probiotic Product (PROBIOTIC DAILY PO) Take 1 tablet by mouth daily.      ramipril  (ALTACE ) 5 MG capsule Take 1 capsule (5 mg total) by mouth daily. 90 capsule 2   SUMAtriptan  (IMITREX ) 50 MG tablet TAKE ONE TABLET BY MOUTH AT ONSET OF HEADACHE. MAY REPEAT IN TWO HOURS IF HEADACHE PERSISTS OR RECURS 9 tablet 5   Vitamin D -Vitamin K (VITAMIN K2-VITAMIN D3 PO) Take by mouth daily.     No current facility-administered medications on file prior to visit.    Review of Systems  Constitutional:  Negative for activity change, appetite change, fatigue, fever and unexpected weight change.  HENT:  Negative for congestion, dental problem, ear discharge, ear pain, rhinorrhea, sinus pressure and sore throat.        Left jaw pain  Pain near ear but not in ear   Eyes:  Negative for pain, redness and visual disturbance.  Respiratory:  Negative for cough, shortness of breath and wheezing.   Cardiovascular:  Negative for chest pain and palpitations.  Gastrointestinal:  Negative for abdominal pain, blood in stool, constipation and diarrhea.  Endocrine: Negative for polydipsia and polyuria.  Genitourinary:  Negative for dysuria, frequency and urgency.  Musculoskeletal:  Negative for arthralgias, back pain and myalgias.  Skin:  Negative for pallor and rash.  Allergic/Immunologic: Negative for environmental allergies.  Neurological:  Negative for dizziness, syncope and headaches.  Hematological:  Negative for adenopathy. Does not bruise/bleed easily.  Psychiatric/Behavioral:  Negative for decreased concentration and dysphoric mood. The patient is not nervous/anxious.        Objective:   Physical Exam Constitutional:      General: She is not in acute distress.    Appearance: Normal appearance. She is obese. She is not  ill-appearing or diaphoretic.  HENT:     Head: Normocephalic and atraumatic.     Comments: Tender over left TMJ joint without clicking or locking Worse pain to move mandible laterally and open jaw wide   Mild swelling in front of ear     Right Ear: Tympanic membrane, ear canal and external ear normal. There is no impacted cerumen.     Left Ear: Tympanic membrane and ear canal normal. There is  no impacted cerumen.     Nose: Nose normal.     Mouth/Throat:     Mouth: Mucous membranes are moist.     Pharynx: Oropharynx is clear. No oropharyngeal exudate or posterior oropharyngeal erythema.  Eyes:     General:        Right eye: No discharge.        Left eye: No discharge.     Conjunctiva/sclera: Conjunctivae normal.     Pupils: Pupils are equal, round, and reactive to light.  Neck:     Vascular: No carotid bruit.  Cardiovascular:     Rate and Rhythm: Normal rate and regular rhythm.     Heart sounds: Normal heart sounds.  Pulmonary:     Effort: Pulmonary effort is normal. No respiratory distress.     Breath sounds: Normal breath sounds. No wheezing or rales.  Musculoskeletal:     Cervical back: Neck supple. No tenderness.  Lymphadenopathy:     Cervical: No cervical adenopathy.  Skin:    General: Skin is warm and dry.     Findings: No bruising, erythema, lesion or rash.  Neurological:     Mental Status: She is alert.     Cranial Nerves: No cranial nerve deficit.  Psychiatric:        Mood and Affect: Mood normal.           Assessment & Plan:   Problem List Items Addressed This Visit       Other   TMJ arthralgia - Primary   Reassuring exam TM joint tenderness and pain with movement but no signs and symptoms of dislocation No temporal pain  Minimal swelling  Recommend warm compresses Ibuprofen with food up to TID for less than a week  Avoid chewing or opening mouth wide  See AVS and handout on TMJ  Update if not starting to improve in a week or if worsening  Call  back and Er precautions noted in detail today

## 2024-02-22 NOTE — Patient Instructions (Signed)
 Continue ibuprofen at max dose of 800 mg three times daily with a meal  Not for more than a week   Use warm compress Do not open mouth wide  Sip liquids with a straw  Don't chew gum  Stay with soft foods this week   Update if not starting to improve in a week or if worsening    If not improved-please call

## 2024-03-05 LAB — HM MAMMOGRAPHY

## 2024-03-06 ENCOUNTER — Encounter: Payer: Self-pay | Admitting: Family Medicine

## 2024-03-18 ENCOUNTER — Other Ambulatory Visit: Payer: Self-pay | Admitting: Family Medicine

## 2024-03-18 DIAGNOSIS — G43709 Chronic migraine without aura, not intractable, without status migrainosus: Secondary | ICD-10-CM

## 2024-03-18 NOTE — Telephone Encounter (Signed)
 Last filled on 05/29/23 # 9 tabs, 5 refill  Last OV was an acute appt on 02/22/24

## 2024-07-08 ENCOUNTER — Other Ambulatory Visit: Payer: Self-pay | Admitting: Family Medicine

## 2024-07-08 DIAGNOSIS — I10 Essential (primary) hypertension: Secondary | ICD-10-CM

## 2024-10-23 ENCOUNTER — Other Ambulatory Visit: Payer: Self-pay | Admitting: Family Medicine

## 2024-10-23 DIAGNOSIS — G43709 Chronic migraine without aura, not intractable, without status migrainosus: Secondary | ICD-10-CM

## 2024-10-24 NOTE — Telephone Encounter (Signed)
 I spoked with patient and she has been scheuled.

## 2024-10-24 NOTE — Telephone Encounter (Signed)
 Lat filled on 03/18/24 #9 tab/ 5 refills   Last OV was an acute visit for facial pain on 02/22/24, no future appts

## 2024-10-24 NOTE — Telephone Encounter (Signed)
 Due for annual exam  Please schedule   Lab prior if pt prefers

## 2024-10-27 ENCOUNTER — Telehealth: Payer: Self-pay | Admitting: Family Medicine

## 2024-10-27 DIAGNOSIS — K760 Fatty (change of) liver, not elsewhere classified: Secondary | ICD-10-CM

## 2024-10-27 DIAGNOSIS — R7989 Other specified abnormal findings of blood chemistry: Secondary | ICD-10-CM

## 2024-10-27 DIAGNOSIS — I10 Essential (primary) hypertension: Secondary | ICD-10-CM

## 2024-10-27 DIAGNOSIS — R7303 Prediabetes: Secondary | ICD-10-CM

## 2024-10-27 DIAGNOSIS — Z0000 Encounter for general adult medical examination without abnormal findings: Secondary | ICD-10-CM

## 2024-10-27 NOTE — Telephone Encounter (Signed)
-----   Message from Veva JINNY Ferrari sent at 10/25/2024 12:08 PM EST ----- Regarding: Lab orders for Wed, 1.21.26 Patient is scheduled for CPX labs, please order future labs, Thanks , Veva

## 2024-10-30 ENCOUNTER — Ambulatory Visit: Payer: Self-pay | Admitting: Family Medicine

## 2024-10-30 ENCOUNTER — Other Ambulatory Visit

## 2024-10-30 DIAGNOSIS — R7303 Prediabetes: Secondary | ICD-10-CM

## 2024-10-30 DIAGNOSIS — I10 Essential (primary) hypertension: Secondary | ICD-10-CM

## 2024-10-30 DIAGNOSIS — R7989 Other specified abnormal findings of blood chemistry: Secondary | ICD-10-CM | POA: Diagnosis not present

## 2024-10-30 LAB — CBC WITH DIFFERENTIAL/PLATELET
Basophils Absolute: 0 K/uL (ref 0.0–0.1)
Basophils Relative: 0.7 % (ref 0.0–3.0)
Eosinophils Absolute: 0.1 K/uL (ref 0.0–0.7)
Eosinophils Relative: 2.3 % (ref 0.0–5.0)
HCT: 43.2 % (ref 36.0–46.0)
Hemoglobin: 14.5 g/dL (ref 12.0–15.0)
Lymphocytes Relative: 34.7 % (ref 12.0–46.0)
Lymphs Abs: 1.7 K/uL (ref 0.7–4.0)
MCHC: 33.6 g/dL (ref 30.0–36.0)
MCV: 86 fl (ref 78.0–100.0)
Monocytes Absolute: 0.6 K/uL (ref 0.1–1.0)
Monocytes Relative: 11.5 % (ref 3.0–12.0)
Neutro Abs: 2.5 K/uL (ref 1.4–7.7)
Neutrophils Relative %: 50.8 % (ref 43.0–77.0)
Platelets: 315 K/uL (ref 150.0–400.0)
RBC: 5.02 Mil/uL (ref 3.87–5.11)
RDW: 13.2 % (ref 11.5–15.5)
WBC: 4.9 K/uL (ref 4.0–10.5)

## 2024-10-30 LAB — LIPID PANEL
Cholesterol: 119 mg/dL (ref 28–200)
HDL: 36.5 mg/dL — ABNORMAL LOW
LDL Cholesterol: 69 mg/dL (ref 10–99)
NonHDL: 82.75
Total CHOL/HDL Ratio: 3
Triglycerides: 71 mg/dL (ref 10.0–149.0)
VLDL: 14.2 mg/dL (ref 0.0–40.0)

## 2024-10-30 LAB — COMPREHENSIVE METABOLIC PANEL WITH GFR
ALT: 23 U/L (ref 3–35)
AST: 21 U/L (ref 5–37)
Albumin: 4.3 g/dL (ref 3.5–5.2)
Alkaline Phosphatase: 75 U/L (ref 39–117)
BUN: 15 mg/dL (ref 6–23)
CO2: 28 meq/L (ref 19–32)
Calcium: 9.4 mg/dL (ref 8.4–10.5)
Chloride: 106 meq/L (ref 96–112)
Creatinine, Ser: 0.79 mg/dL (ref 0.40–1.20)
GFR: 82.67 mL/min
Glucose, Bld: 92 mg/dL (ref 70–99)
Potassium: 4.2 meq/L (ref 3.5–5.1)
Sodium: 140 meq/L (ref 135–145)
Total Bilirubin: 0.4 mg/dL (ref 0.2–1.2)
Total Protein: 6.8 g/dL (ref 6.0–8.3)

## 2024-10-30 LAB — FERRITIN: Ferritin: 62 ng/mL (ref 10.0–291.0)

## 2024-10-30 LAB — HEMOGLOBIN A1C: Hgb A1c MFr Bld: 6 % (ref 4.6–6.5)

## 2024-10-30 LAB — TSH: TSH: 1.02 u[IU]/mL (ref 0.35–5.50)

## 2024-11-06 ENCOUNTER — Encounter: Admitting: Family Medicine

## 2024-11-09 ENCOUNTER — Emergency Department (HOSPITAL_COMMUNITY)

## 2024-11-09 ENCOUNTER — Emergency Department (HOSPITAL_COMMUNITY)
Admission: EM | Admit: 2024-11-09 | Discharge: 2024-11-09 | Disposition: A | Discharge status: Home / Self Care | Attending: Emergency Medicine | Admitting: Emergency Medicine

## 2024-11-09 DIAGNOSIS — S52571A Other intraarticular fracture of lower end of right radius, initial encounter for closed fracture: Secondary | ICD-10-CM | POA: Insufficient documentation

## 2024-11-09 DIAGNOSIS — W000XXA Fall on same level due to ice and snow, initial encounter: Secondary | ICD-10-CM | POA: Diagnosis not present

## 2024-11-09 DIAGNOSIS — S6991XA Unspecified injury of right wrist, hand and finger(s), initial encounter: Secondary | ICD-10-CM | POA: Diagnosis present

## 2024-11-09 DIAGNOSIS — S52611A Displaced fracture of right ulna styloid process, initial encounter for closed fracture: Secondary | ICD-10-CM | POA: Insufficient documentation

## 2024-11-09 DIAGNOSIS — S52501A Unspecified fracture of the lower end of right radius, initial encounter for closed fracture: Secondary | ICD-10-CM

## 2024-11-09 MED ORDER — OXYCODONE-ACETAMINOPHEN 5-325 MG PO TABS: 1.0000 | ORAL_TABLET | ORAL | 0 refills | Status: AC | PRN

## 2024-11-09 MED ORDER — BUPIVACAINE HCL 0.5 % IJ SOLN
50.0000 mL | Freq: Once | INTRAMUSCULAR | Status: AC
  Administered 2024-11-09: 10 mL
  Filled 2024-11-09: qty 50

## 2024-11-09 MED ORDER — OXYCODONE-ACETAMINOPHEN 5-325 MG PO TABS
2.0000 | ORAL_TABLET | Freq: Once | ORAL | Status: AC
  Administered 2024-11-09: 2 via ORAL
  Filled 2024-11-09: qty 2

## 2024-11-09 NOTE — ED Triage Notes (Signed)
 Pt had a fall this morning on the ice and injured her R shoulder. Pt went to UC and diagnosed with compound fx of humerus. Sent to ED for surgical consult. Pt denies head injury/LOC. No blood thinners.

## 2024-11-09 NOTE — Discharge Instructions (Addendum)
 Return if any problems.  Call the Orthopaedist on Monday to be seen for evaluation

## 2024-11-09 NOTE — ED Provider Notes (Signed)
 " [place] EMERGENCY DEPARTMENT AT [facility] Provider Note   CSN: [id number] Arrival date & time: 11/09/24  1133     Patient presents with: Arm Injury   Amber Baker is a 59 y.o. female.   Pt complains of pain in her right wrist.  Pt reports she slipped on the ice and fell injuring her right wrist. Pt denies any other area of injury. Pt was sent from urgent care for concern that she needs to see an Orthopaedist   The history is provided by the patient. The history is limited by a language barrier. No language interpreter was used.  Arm Injury Location:  Wrist Wrist location:  R wrist Injury: no   Pain details:    Quality:  Aching   Radiates to:  R wrist   Severity:  Moderate   Onset quality:  Sudden   Timing:  Constant   Progression:  Worsening Relieved by:  Nothing Ineffective treatments:  None tried      Prior to Admission medications  Medication Sig Start Date End Date Taking? Authorizing Provider  oxyCODONE -acetaminophen  (PERCOCET) 5-325 MG tablet Take 1 tablet by mouth every 4 (four) hours as needed for severe pain (pain score 7-10). 11/09/24 11/09/25 Yes Flint Sonny POUR, PA-C  albuterol  (VENTOLIN  HFA) 108 (90 Base) MCG/ACT inhaler Inhale 2 puffs into the lungs every 4 (four) hours. 06/08/20   [provider]  Ascorbic Acid (VITAMIN C ) 1000 MG tablet Take 1,000 mg by mouth daily.    [provider]  Aspirin-Salicylamide-Caffeine (BC HEADACHE POWDER PO) Take 1 application by mouth daily as needed (Headache).     [provider]  Cholecalciferol (VITAMIN D -3) 125 MCG (5000 UT) TABS Take 5,000 Units by mouth daily.    [provider]  DHEA 10 MG TABS Take 10 mg by mouth daily.     [provider]  Digestive Enzymes (SUPER ENZYMES PO) Take 1 tablet by mouth daily.    [provider]  fexofenadine (ALLEGRA) 180 MG tablet Take 180 mg by mouth daily as needed for allergies.     [provider]   ibuprofen (ADVIL,MOTRIN) 200 MG tablet Take 200 mg by mouth every 6 (six) hours as needed for headache or moderate pain.     [provider]  naproxen sodium (ALEVE) 220 MG tablet Take 220 mg by mouth daily as needed (headache).     [provider]  Probiotic Product (PROBIOTIC DAILY PO) Take 1 tablet by mouth daily.     [provider]  ramipril  (ALTACE ) 5 MG capsule TAKE ONE CAPSULE (5 MG TOTAL) BY MOUTH DAILY. 07/08/24   Tower, Laine LABOR, MD  SUMAtriptan  (IMITREX ) 50 MG tablet TAKE ONE TABLET BY MOUTH AT ONSET OF HEADACHE. MAY REPEAT IN TWO HOURS IF HEADACHE PERSISTS OR RECURS 10/24/24   Tower, Laine LABOR, MD  Vitamin D -Vitamin K (VITAMIN K2-VITAMIN D3 PO) Take by mouth daily.    [provider]    Allergies: Penicillins    Review of Systems  Musculoskeletal:  Positive for joint swelling.  All other systems reviewed and are negative.   Updated Vital Signs BP (!) 172/107 (BP Location: Left Arm)   Pulse 75   Temp 98.3 F (36.8 C) (Oral)   Resp 16   LMP 03/24/2014   SpO2 99%   Physical Exam Vitals and nursing note reviewed.  Constitutional:      Appearance: She is well-developed.  HENT:     Head: Normocephalic.  Cardiovascular:     Rate and Rhythm: Normal rate.  Pulmonary:     Effort: Pulmonary effort is normal.  Abdominal:     General: There is no distension.  Musculoskeletal:        General: Swelling and tenderness present.     Comments: Swollen tender right wrist, good pulse, ns intact   Skin:    General: Skin is warm.  Neurological:     General: No focal deficit present.     Mental Status: She is alert and oriented to person, place, and time.  Psychiatric:        Mood and Affect: Mood normal.     (all labs ordered are listed, but only abnormal results are displayed) Labs Reviewed - No data to display  EKG: None  Radiology: DG Wrist Complete Right Result Date: 11/09/2024 CLINICAL DATA:  Postreduction of right wrist. EXAM:  RIGHT WRIST - COMPLETE 3+ VIEW COMPARISON:  11/09/2024. FINDINGS: There is redemonstration of the comminuted fracture of the distal radius with intra-articular extension and posterior angulation, slightly improved from the previous exam. An ulnar styloid fracture is also seen. Soft tissue swelling is noted about the wrist. IMPRESSION: 1. Comminuted fracture of the distal radius with intra-articular extension and persistent dorsal angulation, slightly improved from the prior exam. 2. Ulnar styloid fracture. Electronically Signed   By: Leita Birmingham M.D.   On: 11/09/2024 14:53   DG Wrist Complete Right Result Date: 11/09/2024 CLINICAL DATA:  injury EXAM: RIGHT WRIST - COMPLETE 3+ VIEW COMPARISON:  None Available. FINDINGS: There is a moderate to severely impacted comminuted fracture of the distal radius with intra-articular extension and dorsal radiation of the radial head in relation to the radial shaft. Osteopenia. Soft tissue edema. No unexpected radiopaque foreign body. IMPRESSION: Moderate to severely impacted comminuted fracture of the distal radius with intra-articular extension and dorsal angulation of the radial head. Electronically Signed   By: Corean Salter M.D.   On: 11/09/2024 12:52     .Reduction of fracture  Date/Time: 11/09/2024 4:54 PM  Performed by: Flint Sonny POUR, PA-C Authorized by: Flint Sonny POUR, PA-C  Consent: Verbal consent obtained Risks and benefits: risks, benefits and alternatives were discussed Consent given by: patient Patient identity confirmed: verbally with patient Time out: Immediately prior to procedure a time out was called to verify the correct patient, procedure, equipment, support staff and site/side marked as required. Preparation: Patient was prepped and draped in the usual sterile fashion. Local anesthesia used: yes Anesthesia: hematoma block  Anesthesia: Local anesthesia used: yes Local Anesthetic: bupivacaine  0.5% without epinephrine Patient  tolerance: patient tolerated the procedure well with no immediate complications Comments: Wrist reduced with finger traps and manipulation,  wrist looks better   .Splint Application  Date/Time: 11/10/2024 3:08 PM  Performed by: Flint Sonny POUR, PA-C Authorized by: Flint Sonny POUR, PA-C   Consent:    Consent obtained:  Verbal   Consent given by:  Patient   Risks, benefits, and alternatives were discussed: yes     Risks discussed:  Swelling, pain, numbness and discoloration Universal protocol:    Procedure explained and questions answered to patient or proxy's satisfaction: yes     Patient identity confirmed:  Verbally with patient Pre-procedure details:    Distal neurologic exam:  Normal   Distal perfusion: distal pulses strong   Procedure details:    Location:  Wrist   Wrist location:  R wrist   Splint type:  Sugar tong   Attestation: Splint applied and  adjusted personally by me   Post-procedure details:    Distal neurologic exam:  Normal   Distal perfusion: distal pulses strong     Procedure completion:  Tolerated   Post-procedure imaging: reviewed      Medications Ordered in the ED  oxyCODONE -acetaminophen  (PERCOCET/ROXICET) 5-325 MG per tablet 2 tablet (2 tablets Oral Given 11/09/24 1259)  bupivacaine  (MARCAINE ) 0.5 % (with pres) injection 50 mL (10 mLs Infiltration Given by Other 11/09/24 1413)                                    Medical Decision Making Pt complains of pain in her right wrist.  Pt was seen at urgent care.   Amount and/or Complexity of Data Reviewed Radiology: ordered and independent interpretation performed. Decision-making details documented in ED Course.    Details: Right wrist  comminuted fracture right distal radius   Risk Prescription drug management. Risk Details: Pt given a prescription for percocet.  Pt advised to call the Orthopaedist to schedule appointment for evaluation next week.         Final diagnoses:  Other closed  intra-articular fracture of distal end of right radius, initial encounter  Closed fracture of distal end of right radius, unspecified fracture morphology, initial encounter    ED Discharge Orders          Ordered    oxyCODONE -acetaminophen  (PERCOCET) 5-325 MG tablet  Every 4 hours PRN        11/09/24 1411           An After Visit Summary was printed and given to the patient.     Flint Sonny MARLA DEVONNA 11/09/24 1701    Garrick Charleston, MD 11/09/24 1752    Flint Sonny MARLA, PA-C 11/10/24 1509    Garrick Charleston, MD 11/10/24 1823  "

## 2024-11-15 ENCOUNTER — Other Ambulatory Visit: Payer: Self-pay

## 2024-11-15 ENCOUNTER — Encounter (HOSPITAL_COMMUNITY): Payer: Self-pay | Admitting: Orthopedic Surgery

## 2024-11-15 NOTE — Progress Notes (Signed)
 PCP - Tower, Laine LABOR, MD  Cardiologist - Denies  EKG - DOS Chest x-ray - Denies ECHO - Denies Cardiac Cath - Denies  Sleep Study-Denies   DM Denies  Blood Thinner Instructions: Denies Aspirin Instructions: Denies  ERAS Protcol - Yes COVID TEST- N/A  Anesthesia review: No  -------------  SDW INSTRUCTIONS:  Your procedure is scheduled on Saturday Feb 7. Please report to [facility] Emergency Department at 0630 A.M., and check in at the Admitting office. Call this number if you have problems the morning of surgery: [phone]   Remember: Do not eat  after midnight the night before your surgery  You may drink clear liquids until 0600 the morning of your surgery.   Clear liquids allowed are: Water, Non-Citrus Juices (without pulp), Carbonated Beverages, Clear Tea, Black Coffee Only, and Gatorade   Medications to take morning of surgery with a sip of water include: fexofenadine (ALLEGRA) , and oxyCODONE -acetaminophen  (PERCOCET)  if needed.  As of today, STOP taking any Aspirin (unless otherwise instructed by your surgeon), Aleve, Naproxen, Ibuprofen, Motrin, Advil, Goody's, BC's, all herbal medications, fish oil, and all vitamins.    The Morning of Surgery Do not wear jewelry, make-up or nail polish. Do not wear lotions, powders, or perfumes/colognes, or deodorant Do not bring valuables to the hospital. [facility] is not responsible for any belongings or valuables.  If you are a smoker, DO NOT Smoke 24 hours prior to surgery  If you wear a CPAP at night please bring your mask the morning of surgery   Remember that you must have someone to transport you home after your surgery, and remain with you for 24 hours if you are discharged the same day.  Please bring cases for contacts, glasses, hearing aids, dentures or bridgework because it cannot be worn into surgery.   Patients discharged the day of surgery will not be allowed to drive home.   Please shower the NIGHT  BEFORE/MORNING OF SURGERY (use antibacterial soap like DIAL soap if possible). Wear comfortable clothes the morning of surgery. Oral Hygiene is also important to reduce your risk of infection.  Remember - BRUSH YOUR TEETH THE MORNING OF SURGERY WITH YOUR REGULAR TOOTHPASTE  Patient denies shortness of breath, fever, cough and chest pain.

## 2024-11-16 ENCOUNTER — Encounter (HOSPITAL_COMMUNITY): Admission: RE | Payer: Self-pay | Source: Home / Self Care

## 2024-11-16 ENCOUNTER — Ambulatory Visit (HOSPITAL_COMMUNITY): Admission: RE | Admit: 2024-11-16 | Source: Home / Self Care | Admitting: Orthopedic Surgery

## 2024-11-20 ENCOUNTER — Encounter: Admitting: Family Medicine
